# Patient Record
Sex: Female | Born: 1980 | Race: White | Hispanic: No | Marital: Married | Smoking: Never smoker
Health system: Southern US, Community
[De-identification: ages and names within clinical notes are randomized; demographics above are authoritative.]

## PROBLEM LIST (undated history)

## (undated) ENCOUNTER — Inpatient Hospital Stay (HOSPITAL_COMMUNITY): Payer: Self-pay

## (undated) DIAGNOSIS — K219 Gastro-esophageal reflux disease without esophagitis: Secondary | ICD-10-CM

## (undated) HISTORY — DX: Morbid (severe) obesity due to excess calories: E66.01

## (undated) HISTORY — PX: CHOLECYSTECTOMY: SHX55

## (undated) HISTORY — PX: WISDOM TOOTH EXTRACTION: SHX21

---

## 2006-03-22 DIAGNOSIS — K5900 Constipation, unspecified: Secondary | ICD-10-CM | POA: Insufficient documentation

## 2008-01-17 ENCOUNTER — Observation Stay: Payer: Self-pay | Admitting: Obstetrics and Gynecology

## 2008-01-21 ENCOUNTER — Inpatient Hospital Stay: Payer: Self-pay | Admitting: Obstetrics & Gynecology

## 2010-04-08 ENCOUNTER — Ambulatory Visit: Payer: Self-pay | Admitting: Family Medicine

## 2010-04-08 IMAGING — US ABDOMEN ULTRASOUND LIMITED
1 series · 17 of 25 positions shown · non-contrast
Comparison: none

REASON FOR EXAM: abd pain
COMMENTS:

[Series 1: abdomen ultrasound limited · 17 of 74 slices shown]
[im 1/74]
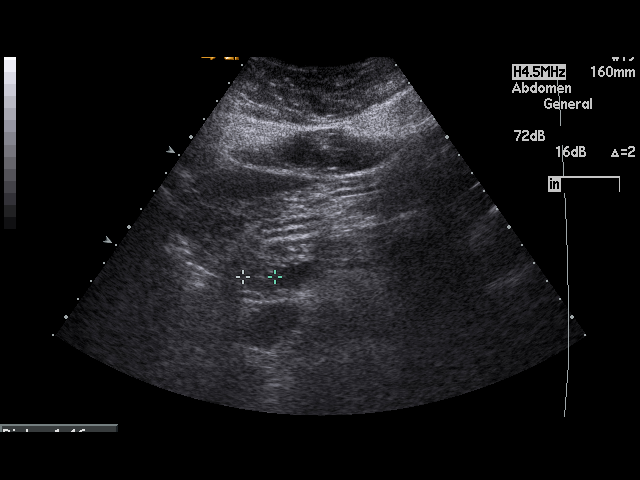
[im 7/74]
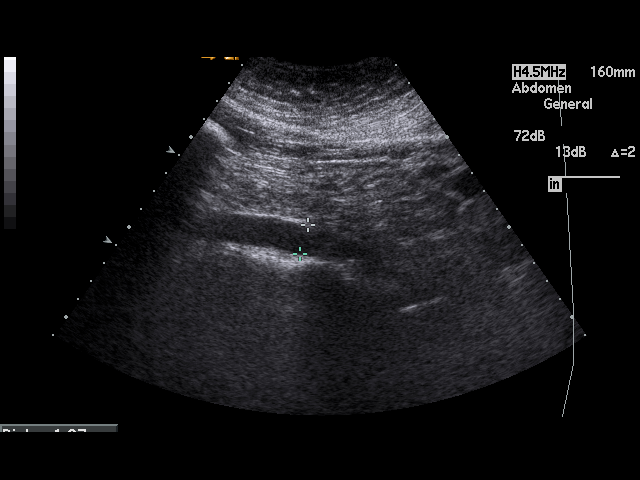
[im 10/74]
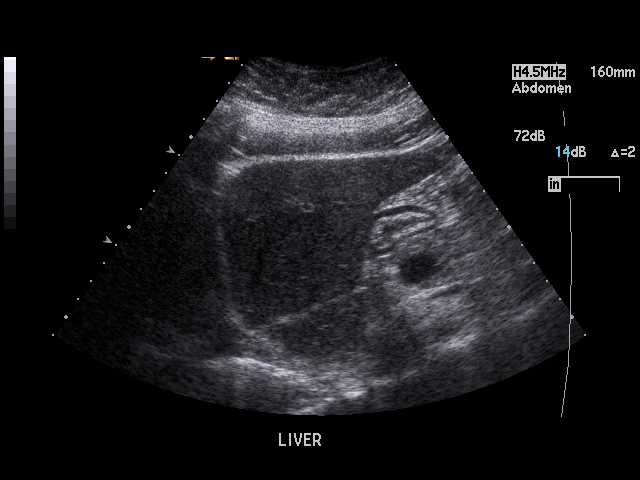
[im 16/74]
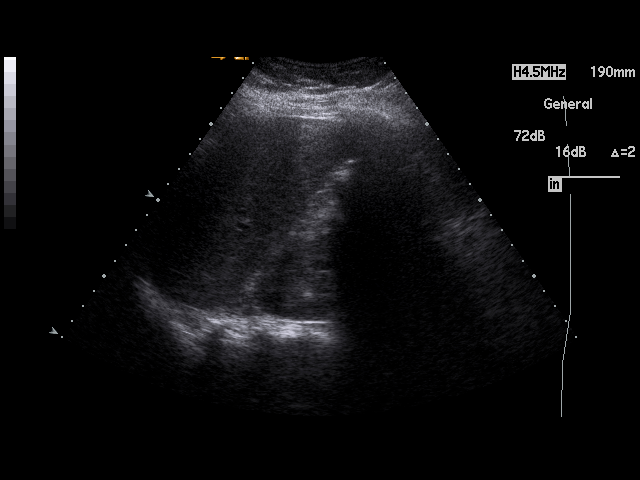
[im 19/74]
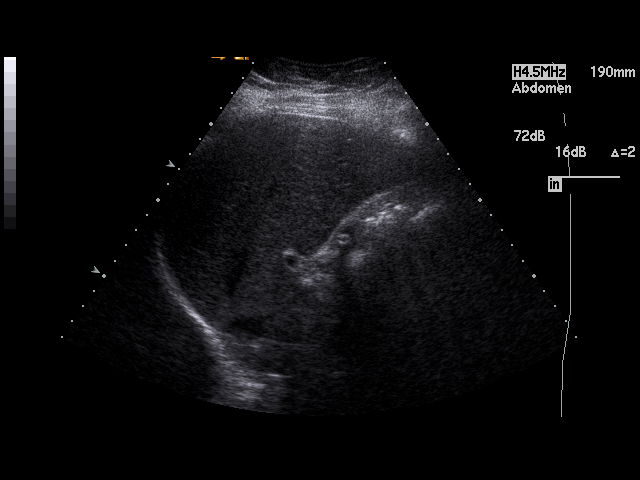
[im 25/74]
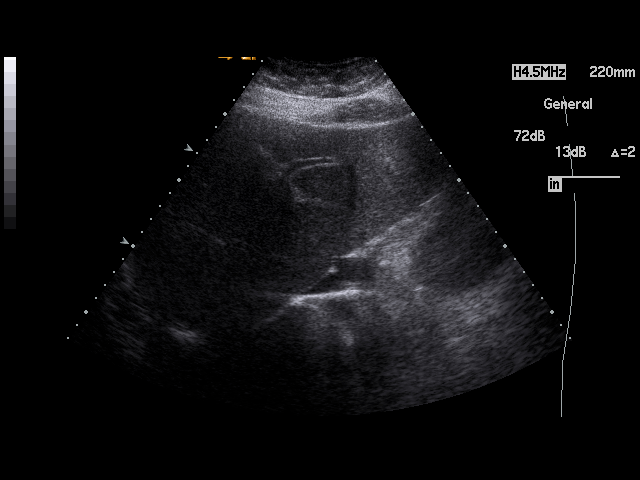
[im 28/74]
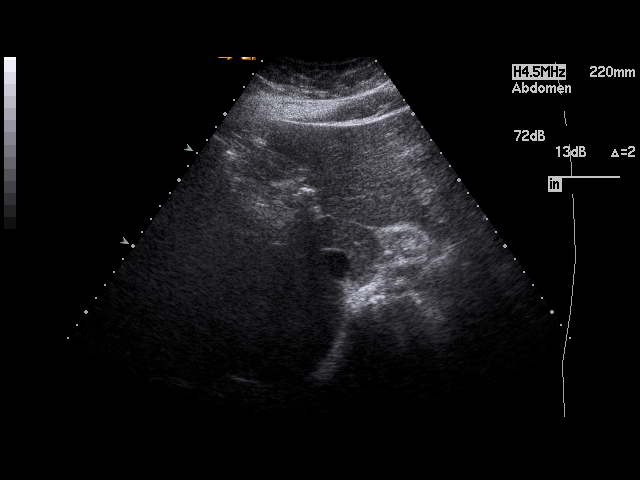
[im 34/74]
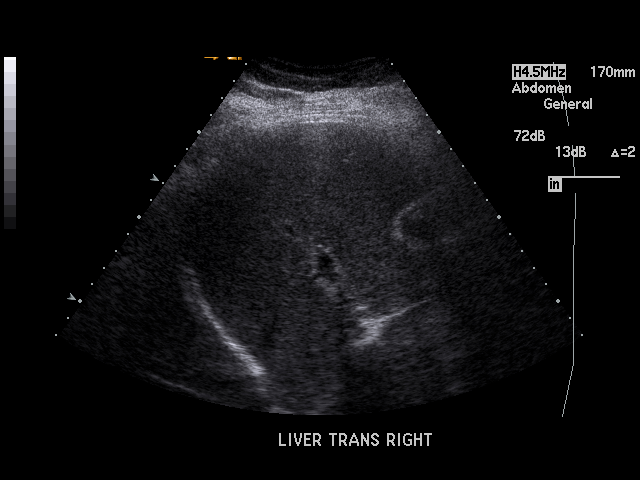
[im 37/74]
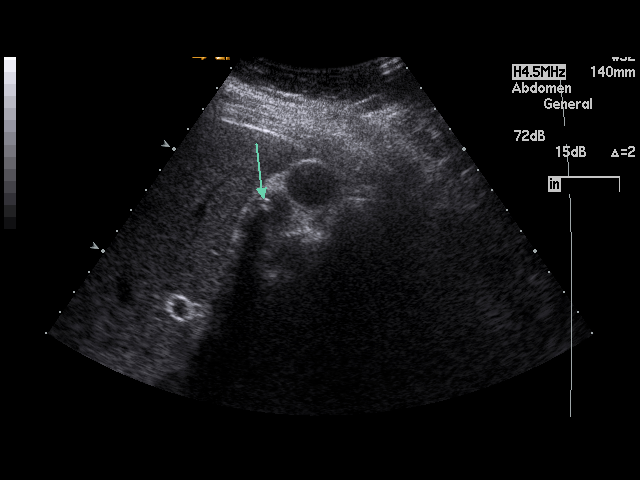
[im 40/74]
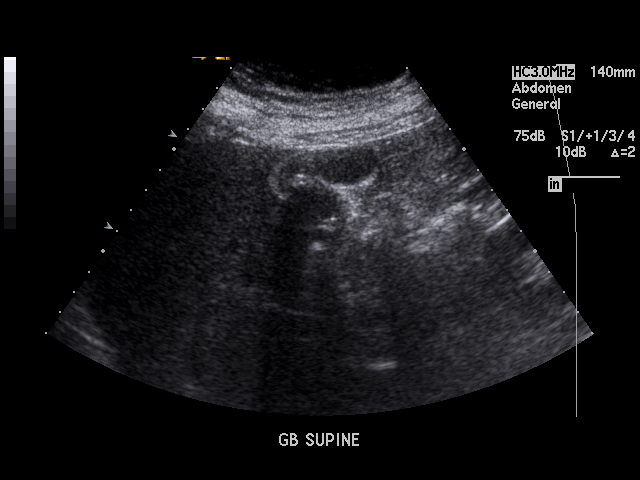
[im 46/74]
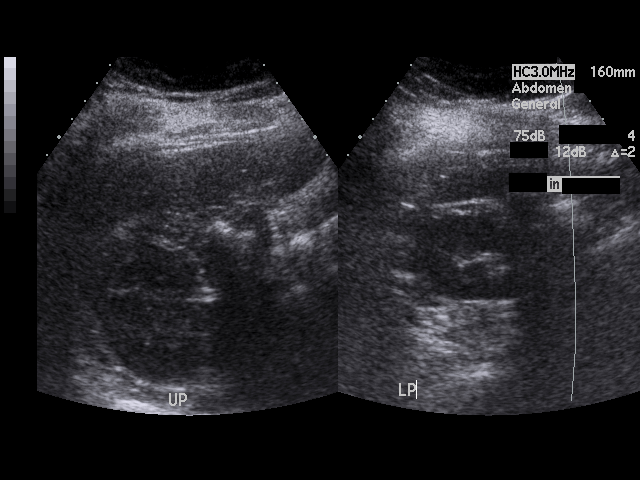
[im 49/74]
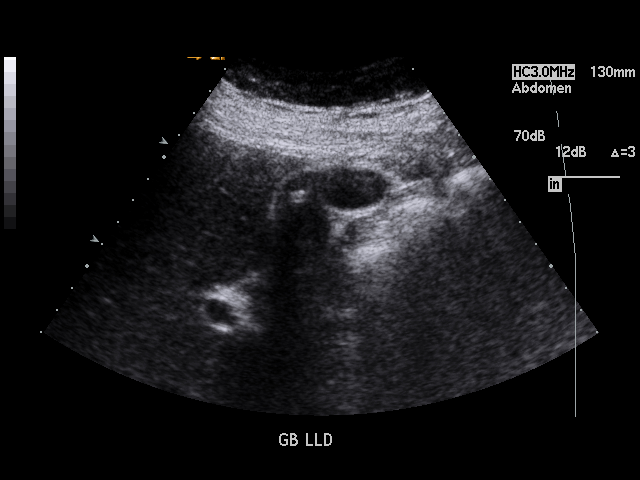
[im 55/74]
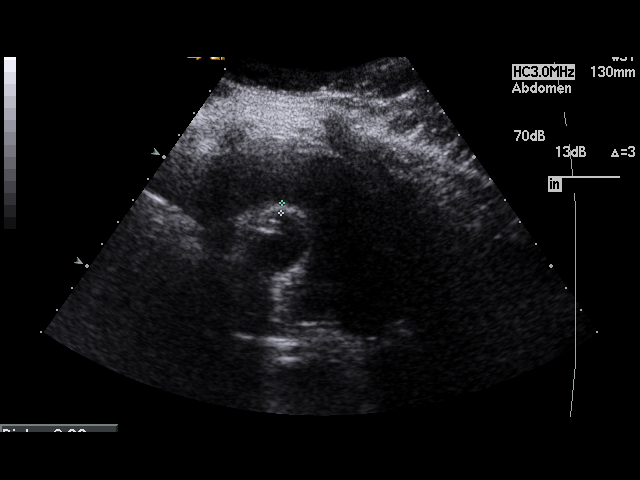
[im 58/74]
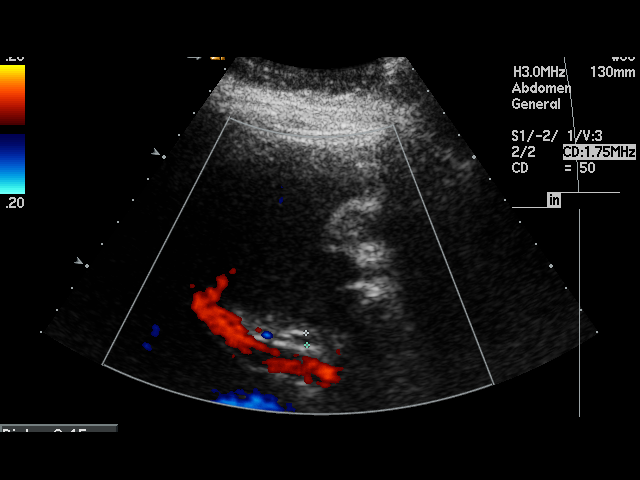
[im 64/74]
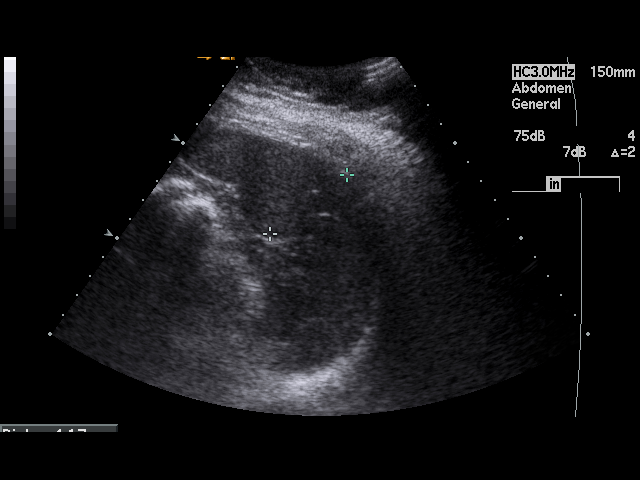
[im 67/74]
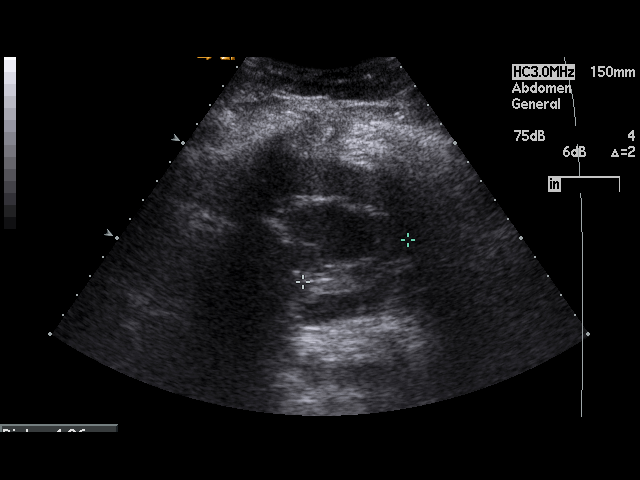
[im 74/74]
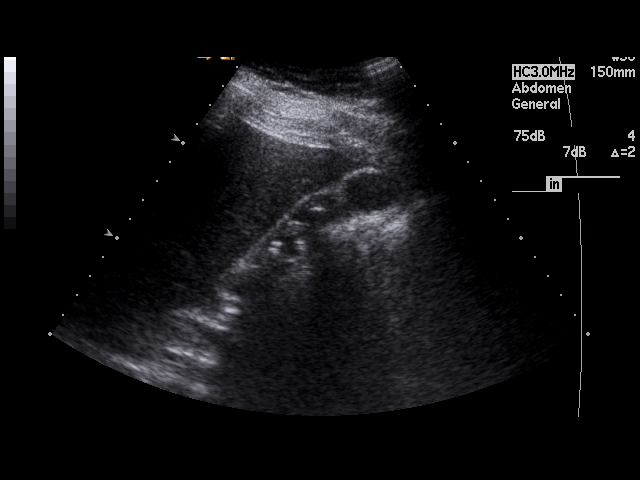

[17 of 25 positions shown; findings below may reference images not displayed]

PROCEDURE:     LONDEN - LONDEN ABDOMEN UPPER GENERAL  - [DATE]  [DATE]

RESULT:     The liver and spleen are normal in appearance. A portion of the
pancreatic tail is obscured by bowel gas but otherwise the pancreas is
normal in appearance. The inferior vena cava and abdominal aorta show no
significant abnormalities. There are multiple shadowing echo densities in
the gallbladder compatible with mobile gallstones. There is thickening of
the gallbladder wall which measures 4.1 mm in thickness. No pericholecystic
fluid is seen. Common bile duct measures 4.5 mm in diameter which is within
normal limits. The kidneys show no hydronephrosis. Sagittally, the right
kidney measures 11.44 cm and the left kidney measures 11.12 cm. No ascites
is seen.
IMPRESSION: 1. Cholelithiasis.
2. There is thickening of the gallbladder wall, suspicious for cholecystitis.

## 2010-05-04 ENCOUNTER — Ambulatory Visit: Payer: Self-pay | Admitting: General Surgery

## 2011-07-18 ENCOUNTER — Encounter: Payer: Self-pay | Admitting: Family Medicine

## 2011-07-18 ENCOUNTER — Ambulatory Visit (INDEPENDENT_AMBULATORY_CARE_PROVIDER_SITE_OTHER): Payer: BC Managed Care – PPO | Admitting: Family Medicine

## 2011-07-18 VITALS — BP 118/76 | HR 70 | Ht 64.0 in | Wt 267.0 lb

## 2011-07-18 DIAGNOSIS — Z3046 Encounter for surveillance of implantable subdermal contraceptive: Secondary | ICD-10-CM

## 2011-07-18 DIAGNOSIS — Z3009 Encounter for other general counseling and advice on contraception: Secondary | ICD-10-CM

## 2011-07-18 DIAGNOSIS — E669 Obesity, unspecified: Secondary | ICD-10-CM | POA: Insufficient documentation

## 2011-07-18 MED ORDER — NORETHIN-ETH ESTRAD BIPHASIC 0.5-35/1-35 MG-MCG PO TABS: 1.0000 | ORAL_TABLET | Freq: Every day | ORAL | Status: DC

## 2011-07-18 NOTE — Patient Instructions (Signed)

## 2011-07-19 NOTE — Progress Notes (Signed)
Subjective:    Patient ID: Patricia Compton, female    DOB: 1981-03-28, 31 y.o.   MRN: [id number]  HPI New pt. Today.  Needs her Implanon removed.  Expired in December of 2012.  Having monthly cycles.  Using condoms.  Desires OC's for contraception.  Needs full physical.  Wants to lose weight. History reviewed. No pertinent past medical history. Past Surgical History  Procedure Date  . Cesarean section   . Wisdom tooth extraction    Family History  Problem Relation Age of Onset  . Hypertension Mother   . Cancer Mother     breast  . COPD Father   . Cancer Maternal Grandmother    History   Social History  . Marital Status: Married    Spouse Name: N/A    Number of Children: N/A  . Years of Education: N/A   Occupational History  . Not on file.   Social History Main Topics  . Smoking status: Never Smoker   . Smokeless tobacco: Not on file  . Alcohol Use: No  . Drug Use: No  . Sexually Active: Yes -- Female partner(s)    Birth Control/ Protection: Implant   Other Topics Concern  . Not on file   Social History Narrative  . No narrative on file   Ms. Kontos does not currently have medications on file. No Known Allergies    Review of Systems  Constitutional: Negative for fever and chills.  HENT: Negative for nosebleeds, congestion and rhinorrhea.   Eyes: Negative for visual disturbance.  Respiratory: Negative for chest tightness and shortness of breath.   Cardiovascular: Negative for chest pain.  Gastrointestinal: Negative for nausea, vomiting, diarrhea, constipation, blood in stool and abdominal distention.       Negative for abdominal pain  Genitourinary: Negative for dysuria and menstrual problem.  Musculoskeletal: Negative for arthralgias.  Neurological: Negative for dizziness and headaches.  Psychiatric/Behavioral: Negative for sleep disturbance and dysphoric mood.  All other systems reviewed and are negative.       Objective:   Physical Exam   Vitals reviewed. Constitutional: She is oriented to person, place, and time. She appears well-developed and well-nourished.  HENT:  Head: Normocephalic and atraumatic.  Eyes: No scleral icterus.  Neck: Neck supple.  Cardiovascular: Normal rate.   Pulmonary/Chest: Effort normal.  Abdominal: Soft.  Neurological: She is alert and oriented to person, place, and time.  Skin: Skin is warm and dry.   Procedure: Patient given informed consent for removal of her Implanon, time out was performed.  Signed copy in the chart.  Appropriate time out taken. Implanon site identified.  Area prepped in usual sterile fashon. One cc of 1% lidocaine was used to anesthetize the area at the distal end of the implant. A small stab incision was made right beside the implant on the distal portion.  The implanon rod was grasped using hemostats and removed without difficulty.  There was less than 3 cc blood loss. There were no complications.  Steri-strips were applied over the small incision.  A pressure bandage was applied to reduce any bruising.  The patient tolerated the procedure well and was given post procedure instructions.         Assessment & Plan:   1. Obesity    Implanon removal  Lengthy discussion was had about weight loss and tactics to help with this, including avoidance of diet sodas, switching to water, increasing exercise, limiting carbs, sweets, fried foods, adding other activities, and food journaling. RTC for  PE

## 2011-08-01 ENCOUNTER — Ambulatory Visit (INDEPENDENT_AMBULATORY_CARE_PROVIDER_SITE_OTHER): Payer: BC Managed Care – PPO | Admitting: Obstetrics & Gynecology

## 2011-08-01 ENCOUNTER — Encounter: Payer: Self-pay | Admitting: Obstetrics & Gynecology

## 2011-08-01 VITALS — BP 120/77 | HR 74 | Ht 64.0 in | Wt 265.0 lb

## 2011-08-01 DIAGNOSIS — Z113 Encounter for screening for infections with a predominantly sexual mode of transmission: Secondary | ICD-10-CM

## 2011-08-01 DIAGNOSIS — Z124 Encounter for screening for malignant neoplasm of cervix: Secondary | ICD-10-CM

## 2011-08-01 DIAGNOSIS — Z01419 Encounter for gynecological examination (general) (routine) without abnormal findings: Secondary | ICD-10-CM

## 2011-08-01 DIAGNOSIS — Z0000 Encounter for general adult medical examination without abnormal findings: Secondary | ICD-10-CM

## 2011-08-01 NOTE — Progress Notes (Signed)
Patient ID: Patricia Compton, female   DOB: Aug 29, 1980, 31 y.o.   MRN: [id number] Subjective:    Patricia Compton is a 31 y.o. female who presents for an annual exam. The patient has no complaints today. The patient is sexually active. GYN screening history: last pap: was normal. The patient wears seatbelts: yes. The patient participates in regular exercise: no. Has the patient ever been transfused or tattooed?: yes.(tattoo) The patient reports that there is not domestic violence in her life.   Menstrual History: OB History    Grav Para Term Preterm Abortions TAB SAB Ect Mult Living   1 1 1       1       Menarche age: 78 Patient's last menstrual period was 08/01/2011.    The following portions of the patient's history were reviewed and updated as appropriate: allergies, current medications, past family history, past medical history, past social history, past surgical history and problem list.  Review of Systems A comprehensive review of systems was negative.    Objective:    BP 120/77  Pulse 74  Ht 5\' 4"  (1.626 m)  Wt 265 lb (120.203 kg)  BMI 45.49 kg/m2  LMP 08/01/2011  General Appearance:    Alert, cooperative, no distress, appears stated age  Head:    Normocephalic, without obvious abnormality, atraumatic  Eyes:    PERRL, conjunctiva/corneas clear, EOM's intact, fundi    benign, both eyes  Ears:    Normal TM's and external ear canals, both ears  Nose:   Nares normal, septum midline, mucosa normal, no drainage    or sinus tenderness  Throat:   Lips, mucosa, and tongue normal; teeth and gums normal  Neck:   Supple, symmetrical, trachea midline, no adenopathy;    thyroid:  no enlargement/tenderness/nodules; no carotid   bruit or JVD  Back:     Symmetric, no curvature, ROM normal, no CVA tenderness  Lungs:     Clear to auscultation bilaterally, respirations unlabored  Chest Wall:    No tenderness or deformity   Heart:    Regular rate and rhythm, S1 and S2 normal, no murmur,  rub   or gallop  Breast Exam:    No tenderness, masses, or nipple abnormality  Abdomen:     Soft, non-tender, bowel sounds active all four quadrants,    no masses, no organomegaly  Genitalia:    Normal female without lesion, discharge or tenderness, NSSA, NT, no adnexal masses     Extremities:   Extremities normal, atraumatic, no cyanosis or edema  Pulses:   2+ and symmetric all extremities  Skin:   Skin color, texture, turgor normal, no rashes or lesions  Lymph nodes:   Cervical, supraclavicular, and axillary nodes normal  Neurologic:   CNII-XII intact, normal strength, sensation and reflexes    throughout  .    Assessment:    Healthy female exam.    Plan:     Pap smear.

## 2011-08-01 NOTE — Progress Notes (Signed)
Here for Yearly Exam.  Doing well.  Is a candidate for BRCA testing mom in her 40's and grandmother.

## 2011-11-15 ENCOUNTER — Encounter: Payer: Self-pay | Admitting: Gynecology

## 2011-11-15 ENCOUNTER — Ambulatory Visit (INDEPENDENT_AMBULATORY_CARE_PROVIDER_SITE_OTHER): Payer: BC Managed Care – PPO | Admitting: Gynecology

## 2011-11-15 VITALS — BP 109/77 | Wt 269.0 lb

## 2011-11-15 DIAGNOSIS — Z3480 Encounter for supervision of other normal pregnancy, unspecified trimester: Secondary | ICD-10-CM

## 2011-11-15 DIAGNOSIS — N912 Amenorrhea, unspecified: Secondary | ICD-10-CM

## 2011-11-16 LAB — OBSTETRIC PANEL
Antibody Screen: NEGATIVE
Basophils Relative: 0 % (ref 0–1)
Eosinophils Absolute: 0.1 10*3/uL (ref 0.0–0.7)
Eosinophils Relative: 1 % (ref 0–5)
HCT: 38.1 % (ref 36.0–46.0)
Hemoglobin: 12.8 g/dL (ref 12.0–15.0)
MCH: 29.7 pg (ref 26.0–34.0)
MCHC: 33.6 g/dL (ref 30.0–36.0)
MCV: 88.4 fL (ref 78.0–100.0)
Monocytes Absolute: 0.5 10*3/uL (ref 0.1–1.0)
Monocytes Relative: 5 % (ref 3–12)
RDW: 13.4 % (ref 11.5–15.5)
Rh Type: NEGATIVE

## 2011-11-16 LAB — HCG, QUANTITATIVE, PREGNANCY: hCG, Beta Chain, Quant, S: 1943.1 m[IU]/mL

## 2011-11-19 LAB — CYSTIC FIBROSIS DIAGNOSTIC STUDY

## 2011-11-20 ENCOUNTER — Ambulatory Visit (INDEPENDENT_AMBULATORY_CARE_PROVIDER_SITE_OTHER): Payer: BC Managed Care – PPO | Admitting: Obstetrics & Gynecology

## 2011-11-20 ENCOUNTER — Other Ambulatory Visit: Payer: Self-pay | Admitting: Obstetrics & Gynecology

## 2011-11-20 ENCOUNTER — Encounter: Payer: Self-pay | Admitting: Obstetrics & Gynecology

## 2011-11-20 VITALS — BP 120/71 | Wt 269.0 lb

## 2011-11-20 DIAGNOSIS — E669 Obesity, unspecified: Secondary | ICD-10-CM

## 2011-11-20 DIAGNOSIS — Z3682 Encounter for antenatal screening for nuchal translucency: Secondary | ICD-10-CM

## 2011-11-20 DIAGNOSIS — O34219 Maternal care for unspecified type scar from previous cesarean delivery: Secondary | ICD-10-CM | POA: Insufficient documentation

## 2011-11-20 DIAGNOSIS — O99210 Obesity complicating pregnancy, unspecified trimester: Secondary | ICD-10-CM

## 2011-11-20 DIAGNOSIS — Z3480 Encounter for supervision of other normal pregnancy, unspecified trimester: Secondary | ICD-10-CM | POA: Insufficient documentation

## 2011-11-20 NOTE — Progress Notes (Signed)
She says her "period" in June was very light and abnormal. On exam today (greatly difficult due to body habitus), I do think she has a 10 week size uterus. She does want the First Screen  Subjective:    Patricia Compton is a G2P1001 [redacted]w[redacted]d being seen today for her first obstetrical visit.  Her obstetrical history is significant for obesity in pregnancy. Patient does intend to breast feed. Pregnancy history fully reviewed.  Patient reports no complaints.  Filed Vitals:   11/20/11 1509  BP: 120/71  Weight: 269 lb (122.018 kg)    HISTORY: OB History    Grav Para Term Preterm Abortions TAB SAB Ect Mult Living   2 1 1       1      # Outc Date GA Lbr Len/2nd Wgt Sex Del Anes PTL Lv   1 TRM 10/09 [redacted]w[redacted]d   M LTCS EPI  Yes   2 CUR              Past Medical History  Diagnosis Date  . Morbid obesity    Past Surgical History  Procedure Date  . Cesarean section   . Wisdom tooth extraction   . Cholecystectomy    Family History  Problem Relation Age of Onset  . Hypertension Mother   . Cancer Mother     breast  . COPD Father   . Cancer Maternal Grandmother      Exam    Uterus:     Pelvic Exam:    Perineum: No Hemorrhoids   Vulva: normal   Vagina:  normal mucosa   pH:    Cervix: anteverted   Adnexa: normal adnexa   Bony Pelvis: android  System: Breast:  normal appearance, no masses or tenderness   Skin: normal coloration and turgor, no rashes    Neurologic: oriented   Extremities: normal strength, tone, and muscle mass   HEENT PERRLA   Mouth/Teeth mucous membranes moist, pharynx normal without lesions   Neck supple   Cardiovascular: regular rate and rhythm   Respiratory:  appears well, vitals normal, no respiratory distress, acyanotic, normal RR, ear and throat exam is normal, neck free of mass or lymphadenopathy, chest clear, no wheezing, crepitations, rhonchi, normal symmetric air entry   Abdomen: soft, non-tender; bowel sounds normal; no masses,  no organomegaly   Urinary: urethral meatus normal      Assessment:    Pregnancy: G2P1001 Patient Active Problem List  Diagnosis  . Obesity        Plan:     Initial labs drawn. Prenatal vitamins. Problem list reviewed and updated. Genetic Screening discussed First Screen and Quad Screen: undecided.  Ultrasound discussed; fetal survey: I have offered her a nutrition consult and have recommended less than 20 pound weight gain in pregnancy due to risks of excessive gain with maternal obesity.   Follow up in 4 weeks.   Kevin Mario C. 11/20/2011

## 2011-11-21 LAB — GC/CHLAMYDIA PROBE AMP, URINE
Chlamydia, Swab/Urine, PCR: NEGATIVE
GC Probe Amp, Urine: NEGATIVE

## 2011-11-22 LAB — URINE CULTURE

## 2011-11-29 ENCOUNTER — Ambulatory Visit (HOSPITAL_COMMUNITY): Admission: RE | Admit: 2011-11-29 | Payer: BC Managed Care – PPO | Source: Ambulatory Visit

## 2011-11-29 ENCOUNTER — Ambulatory Visit (HOSPITAL_COMMUNITY)
Admission: RE | Admit: 2011-11-29 | Discharge: 2011-11-29 | Disposition: A | Discharge status: Home / Self Care | Payer: BC Managed Care – PPO | Source: Ambulatory Visit | Attending: Obstetrics & Gynecology | Admitting: Obstetrics & Gynecology

## 2011-11-29 ENCOUNTER — Other Ambulatory Visit: Payer: Self-pay | Admitting: Obstetrics & Gynecology

## 2011-11-29 ENCOUNTER — Encounter (HOSPITAL_COMMUNITY): Payer: Self-pay

## 2011-11-29 VITALS — BP 123/74 | HR 87 | Wt 268.0 lb

## 2011-11-29 DIAGNOSIS — O34219 Maternal care for unspecified type scar from previous cesarean delivery: Secondary | ICD-10-CM | POA: Insufficient documentation

## 2011-11-29 DIAGNOSIS — Z3689 Encounter for other specified antenatal screening: Secondary | ICD-10-CM | POA: Insufficient documentation

## 2011-11-29 DIAGNOSIS — O99210 Obesity complicating pregnancy, unspecified trimester: Secondary | ICD-10-CM

## 2011-11-29 DIAGNOSIS — Z3682 Encounter for antenatal screening for nuchal translucency: Secondary | ICD-10-CM

## 2011-11-29 IMAGING — US US OB TRANSVAGINAL
1 series · 13 of 28 positions shown · non-contrast
Comparison: none

[Series 1: us ob transvaginal · 0.17mm/px · 13 of 33 slices shown]
[im 2/33]
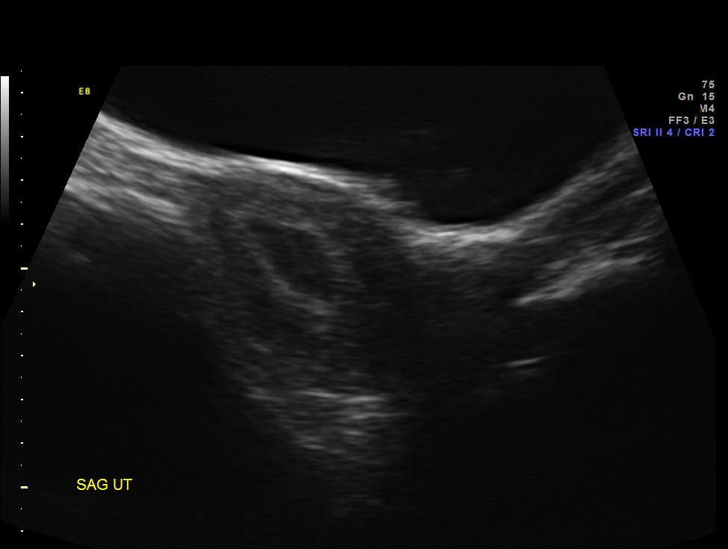
[im 4/33]
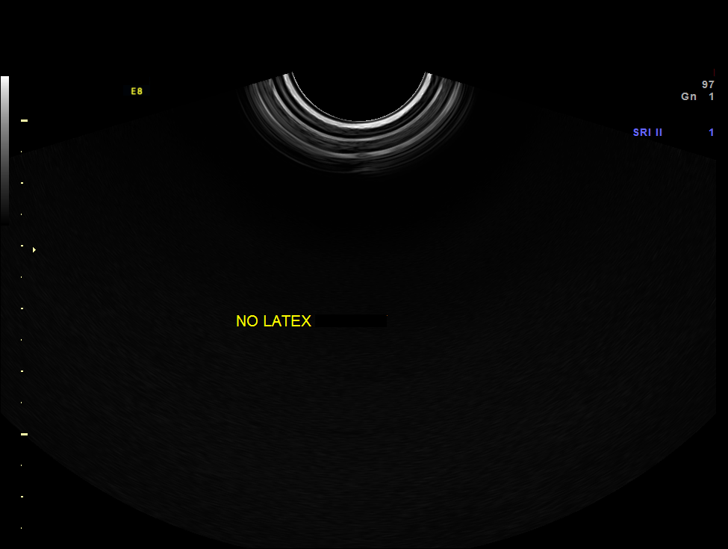
[im 6/33]
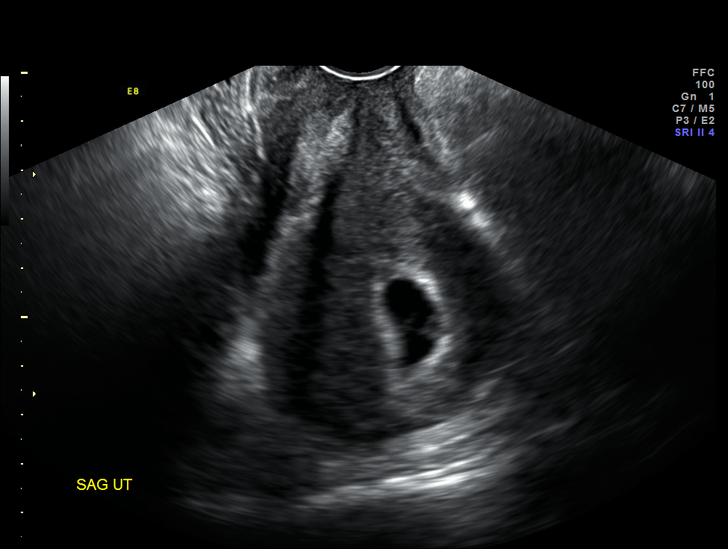
[im 9/33]
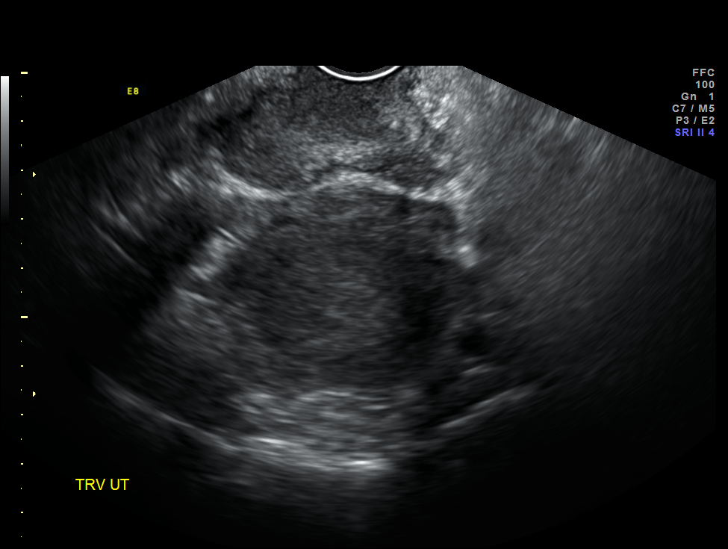
[im 11/33]
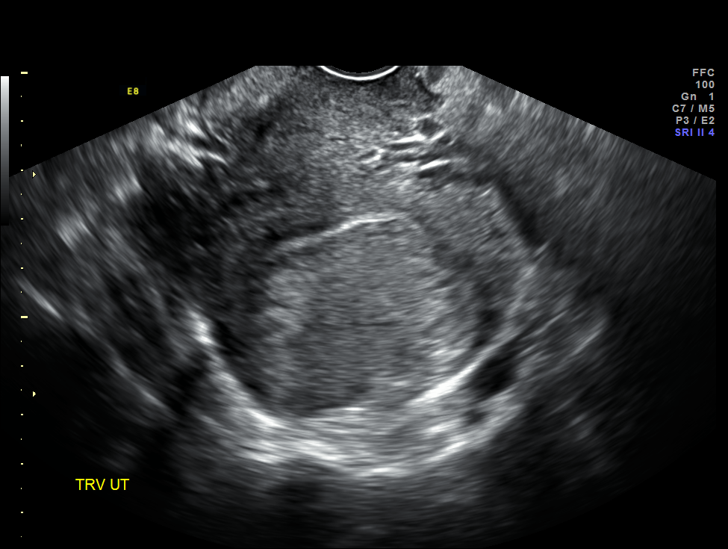
[im 14/33]
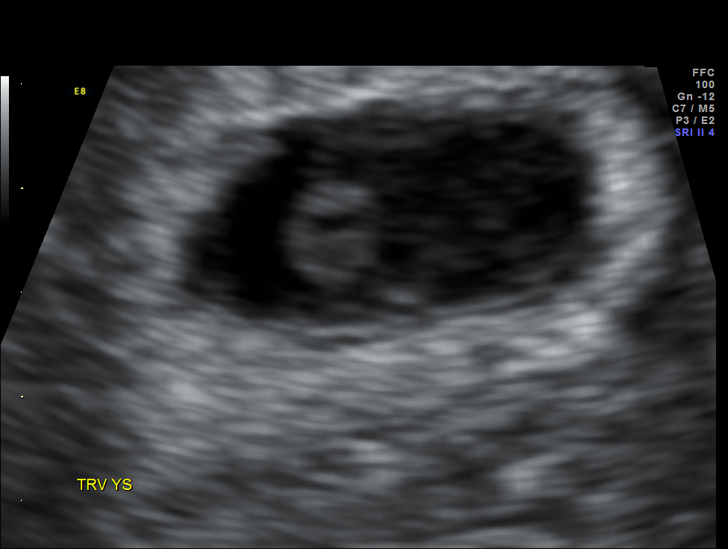
[im 17/33]
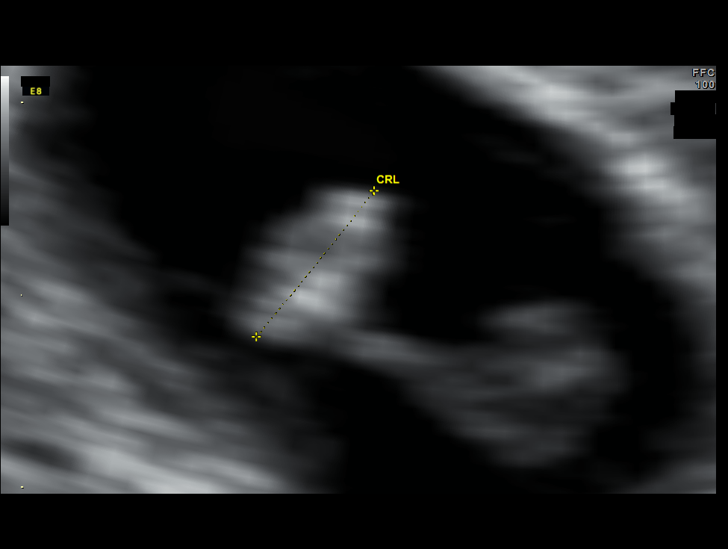
[im 19/33]
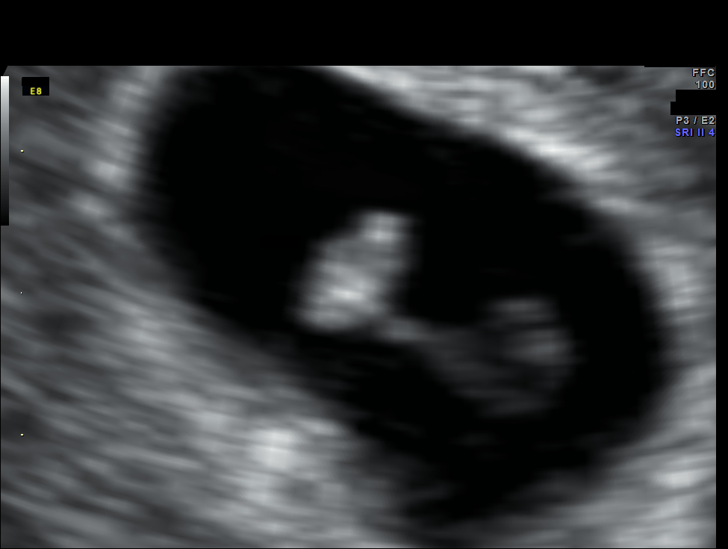
[im 22/33]
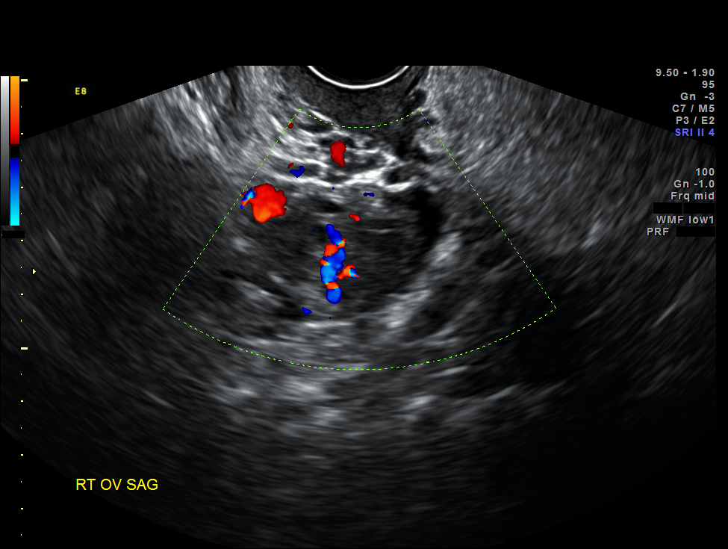
[im 24/33]
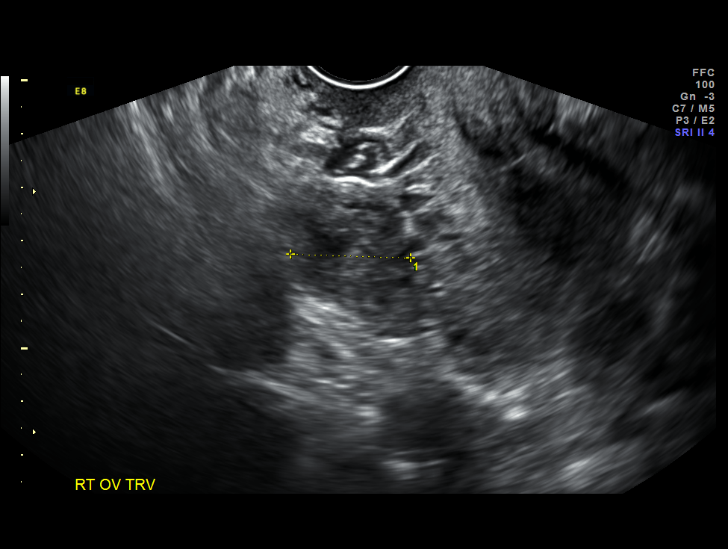
[im 27/33]
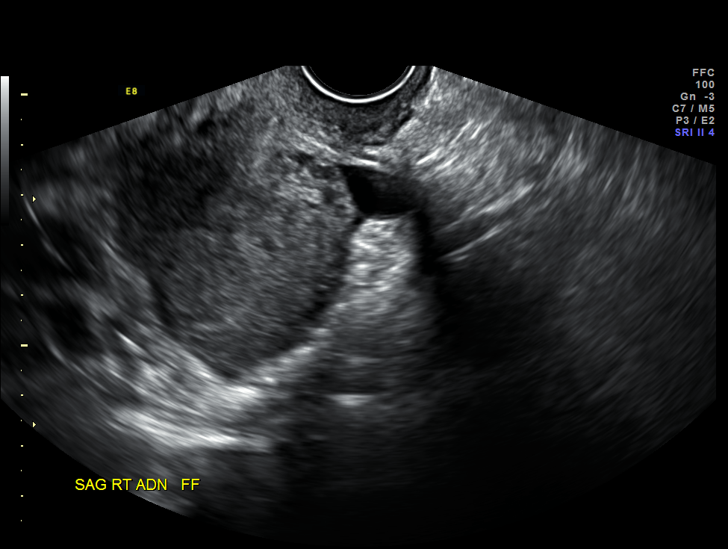
[im 29/33]
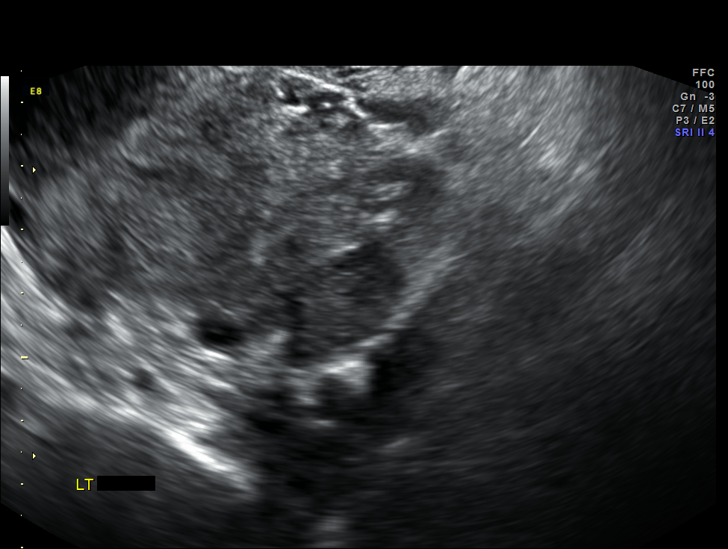
[im 31/33]
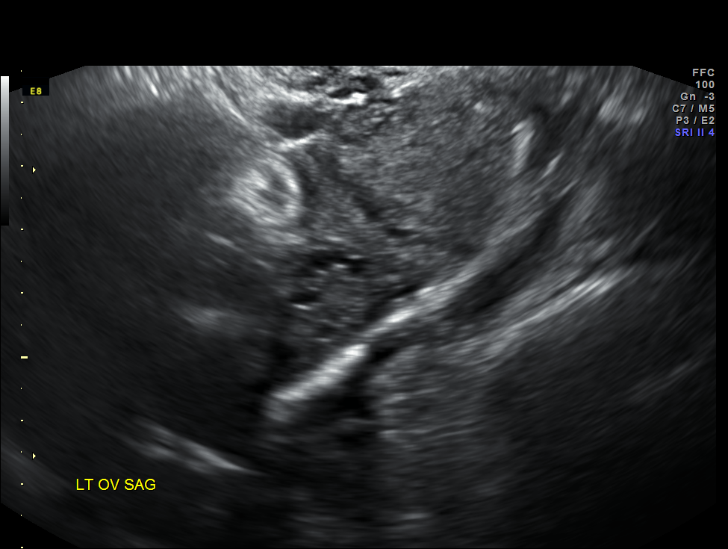

[13 of 28 positions shown; findings below may reference images not displayed]

OBSTETRICS REPORT
                      (Signed Final [DATE] [DATE])

 Order#:         [PHONE_NUMBER]_O,[6L]
                 51_O
Procedures

 US OB TRANSVAGINAL                                    76817.0
 US OB COMP LESS 14 WKS                                76801.0
Indications

 Unsure of LMP;  Establish Gestational [AGE]
 Assess viability
 Previous cesarean section
Fetal Evaluation

 Preg. Location:    Intrauterine
 Gest. Sac:         Intrauterine
 Yolk Sac:          Visualized
 Fetal Pole:        Visualized
 Fetal Heart Rate:  121                          bpm
 Cardiac Activity:  Observed
Biometry

 CRL:      4.8  mm     G. Age:  6w 1d                  EDD:    [DATE]
Gestational Age

 LMP:           7w 6d         Date:  [DATE]                 EDD:   [DATE]
 Best:          6w 1d      Det. By:  U/S C R L ([DATE])     EDD:   [DATE]
Cervix Uterus Adnexa

 Cervix:       Normal appearance by transvaginal scan
 Uterus:       Retroverted.
 Cul De Sac:   Small amount of free fluid seen.
 Left Ovary:    Within normal limits.
 Right Ovary:   Within normal limits. Small corpus luteum noted.

 Adnexa:     No abnormality visualized.
Impression

 Single IUP at 6 [DATE] weeks based on today's study
 New EDC based on today's ultrasound - [DATE]
Recommendations

 Recommend follow up in 6-7 weeks for first trimester screen.

## 2011-11-29 NOTE — Progress Notes (Signed)
Patricia Compton  was seen today for an ultrasound appointment.  See full report in [facility].  Alpha Gula, MD  Single IUP at 6 1/7 weeks based on today's study [facility] based on today's ultrasound - 07/23/2012  Recommend follow up in 6-7 weeks for first trimester screen.

## 2011-12-08 ENCOUNTER — Other Ambulatory Visit (HOSPITAL_COMMUNITY): Payer: BC Managed Care – PPO

## 2011-12-19 ENCOUNTER — Ambulatory Visit (INDEPENDENT_AMBULATORY_CARE_PROVIDER_SITE_OTHER): Payer: BC Managed Care – PPO | Admitting: Obstetrics & Gynecology

## 2011-12-19 VITALS — BP 119/68 | Wt 265.0 lb

## 2011-12-19 DIAGNOSIS — O99210 Obesity complicating pregnancy, unspecified trimester: Secondary | ICD-10-CM

## 2011-12-19 DIAGNOSIS — N390 Urinary tract infection, site not specified: Secondary | ICD-10-CM

## 2011-12-19 DIAGNOSIS — O34219 Maternal care for unspecified type scar from previous cesarean delivery: Secondary | ICD-10-CM

## 2011-12-19 DIAGNOSIS — Z3480 Encounter for supervision of other normal pregnancy, unspecified trimester: Secondary | ICD-10-CM

## 2011-12-19 DIAGNOSIS — E669 Obesity, unspecified: Secondary | ICD-10-CM

## 2011-12-19 LAB — POCT URINALYSIS DIPSTICK
Ketones, UA: NEGATIVE
Spec Grav, UA: <=1.005
pH, UA: 5

## 2011-12-19 NOTE — Progress Notes (Signed)
UA with large blood, trace protein, no LE. Will send for culture and treat accordingly.  Has appointment 01/12/12 for NT.  Recommended Prilosec OTC for heartburn not alleviated by Zantac.  Will monitor symptoms. Obstetric precautions advised.

## 2011-12-19 NOTE — Progress Notes (Signed)
Has lots of indigestion and reflux, unable to drink a lot of liquids because of this.  Stays thirsty.  Burning with urination.

## 2011-12-19 NOTE — Patient Instructions (Addendum)
Pregnancy - First Trimester  During sexual intercourse, millions of sperm go into the vagina. Only 1 sperm will penetrate and fertilize the female egg while it is in the Fallopian tube. One week later, the fertilized egg implants into the wall of the uterus. An embryo begins to develop into a baby. At 6 to 8 weeks, the eyes and face are formed and the heartbeat can be seen on ultrasound. At the end of 12 weeks (first trimester), all the baby's organs are formed. Now that you are pregnant, you will want to do everything you can to have a healthy baby. Two of the most important things are to get good prenatal care and follow your caregiver's instructions. Prenatal care is all the medical care you receive before the baby's birth. It is given to prevent, find, and treat problems during the pregnancy and childbirth.  PRENATAL EXAMS   During prenatal visits, your weight, blood pressure and urine are checked. This is done to make sure you are healthy and progressing normally during the pregnancy.   A pregnant woman should gain 25 to 35 pounds during the pregnancy. However, if you are over weight or underweight, your caregiver will advise you regarding your weight.   Your caregiver will ask and answer questions for you.   Blood work, cervical cultures, other necessary tests and a Pap test are done during your prenatal exams. These tests are done to check on your health and the probable health of your baby. Tests are strongly recommended and done for HIV with your permission. This is the virus that causes AIDS. These tests are done because medications can be given to help prevent your baby from being born with this infection should you have been infected without knowing it. Blood work is also used to find out your blood type, previous infections and follow your blood levels (hemoglobin).   Low hemoglobin (anemia) is common during pregnancy. Iron and vitamins are given to help prevent this. Later in the pregnancy, blood  tests for diabetes will be done along with any other tests if any problems develop. You may need tests to make sure you and the baby are doing well.   You may need other tests to make sure you and the baby are doing well.  CHANGES DURING THE FIRST TRIMESTER (THE FIRST 3 MONTHS OF PREGNANCY)  Your body goes through many changes during pregnancy. They vary from person to person. Talk to your caregiver about changes you notice and are concerned about. Changes can include:   Your menstrual period stops.   The egg and sperm carry the genes that determine what you look like. Genes from you and your partner are forming a baby. The female genes determine whether the baby is a boy or a girl.   Your body increases in girth and you may feel bloated.   Feeling sick to your stomach (nauseous) and throwing up (vomiting). If the vomiting is uncontrollable, call your caregiver.   Your breasts will begin to enlarge and become tender.   Your nipples may stick out more and become darker.   The need to urinate more. Painful urination may mean you have a bladder infection.   Tiring easily.   Loss of appetite.   Cravings for certain kinds of food.   At first, you may gain or lose a couple of pounds.   You may have changes in your emotions from day to day (excited to be pregnant or concerned something may go wrong with   the pregnancy and baby).   You may have more vivid and strange dreams.  HOME CARE INSTRUCTIONS    It is very important to avoid all smoking, alcohol and un-prescribed drugs during your pregnancy. These affect the formation and growth of the baby. Avoid chemicals while pregnant to ensure the delivery of a healthy infant.   Start your prenatal visits by the 12th week of pregnancy. They are usually scheduled monthly at first, then more often in the last 2 months before delivery. Keep your caregiver's appointments. Follow your caregiver's instructions regarding medication use, blood and lab tests, exercise, and  diet.   During pregnancy, you are providing food for you and your baby. Eat regular, well-balanced meals. Choose foods such as meat, fish, milk and other low fat dairy products, vegetables, fruits, and whole-grain breads and cereals. Your caregiver will tell you of the ideal weight gain.   You can help morning sickness by keeping soda crackers at the bedside. Eat a couple before arising in the morning. You may want to use the crackers without salt on them.   Eating 4 to 5 small meals rather than 3 large meals a day also may help the nausea and vomiting.   Drinking liquids between meals instead of during meals also seems to help nausea and vomiting.   A physical sexual relationship may be continued throughout pregnancy if there are no other problems. Problems may be early (premature) leaking of amniotic fluid from the membranes, vaginal bleeding, or belly (abdominal) pain.   Exercise regularly if there are no restrictions. Check with your caregiver or physical therapist if you are unsure of the safety of some of your exercises. Greater weight gain will occur in the last 2 trimesters of pregnancy. Exercising will help:   Control your weight.   Keep you in shape.   Prepare you for labor and delivery.   Help you lose your pregnancy weight after you deliver your baby.   Wear a good support or jogging bra for breast tenderness during pregnancy. This may help if worn during sleep too.   Ask when prenatal classes are available. Begin classes when they are offered.   Do not use hot tubs, steam rooms or saunas.   Wear your seat belt when driving. This protects you and your baby if you are in an accident.   Avoid raw meat, uncooked cheese, cat litter boxes and soil used by cats throughout the pregnancy. These carry germs that can cause birth defects in the baby.   The first trimester is a good time to visit your dentist for your dental health. Getting your teeth cleaned is OK. Use a softer toothbrush and brush  gently during pregnancy.   Ask for help if you have financial, counseling or nutritional needs during pregnancy. Your caregiver will be able to offer counseling for these needs as well as refer you for other special needs.   Do not take any medications or herbs unless told by your caregiver.   Inform your caregiver if there is any mental or physical domestic violence.   Make a list of emergency phone numbers of family, friends, hospital, and police and fire departments.   Write down your questions. Take them to your prenatal visit.   Do not douche.   Do not cross your legs.   If you have to stand for long periods of time, rotate you feet or take small steps in a circle.   You may have more vaginal secretions that may   require a sanitary pad. Do not use tampons or scented sanitary pads.  MEDICATIONS AND DRUG USE IN PREGNANCY   Take prenatal vitamins as directed. The vitamin should contain 1 milligram of folic acid. Keep all vitamins out of reach of children. Only a couple vitamins or tablets containing iron may be fatal to a baby or young child when ingested.   Avoid use of all medications, including herbs, over-the-counter medications, not prescribed or suggested by your caregiver. Only take over-the-counter or prescription medicines for pain, discomfort, or fever as directed by your caregiver. Do not use aspirin, ibuprofen, or naproxen unless directed by your caregiver.   Let your caregiver also know about herbs you may be using.   Alcohol is related to a number of birth defects. This includes fetal alcohol syndrome. All alcohol, in any form, should be avoided completely. Smoking will cause low birth rate and premature babies.   Street or illegal drugs are very harmful to the baby. They are absolutely forbidden. A baby born to an addicted mother will be addicted at birth. The baby will go through the same withdrawal an adult does.   Let your caregiver know about any medications that you have to take  and for what reason you take them.  MISCARRIAGE IS COMMON DURING PREGNANCY  A miscarriage does not mean you did something wrong. It is not a reason to worry about getting pregnant again. Your caregiver will help you with questions you may have. If you have a miscarriage, you may need minor surgery.  SEEK MEDICAL CARE IF:   You have any concerns or worries during your pregnancy. It is better to call with your questions if you feel they cannot wait, rather than worry about them.  SEEK IMMEDIATE MEDICAL CARE IF:    An unexplained oral temperature above 102 F (38.9 C) develops, or as your caregiver suggests.   You have leaking of fluid from the vagina (birth canal). If leaking membranes are suspected, take your temperature and inform your caregiver of this when you call.   There is vaginal spotting or bleeding. Notify your caregiver of the amount and how many pads are used.   You develop a bad smelling vaginal discharge with a change in the color.   You continue to feel sick to your stomach (nauseated) and have no relief from remedies suggested. You vomit blood or coffee ground-like materials.   You lose more than 2 pounds of weight in 1 week.   You gain more than 2 pounds of weight in 1 week and you notice swelling of your face, hands, feet, or legs.   You gain 5 pounds or more in 1 week (even if you do not have swelling of your hands, face, legs, or feet).   You get exposed to German measles and have never had them.   You are exposed to fifth disease or chickenpox.   You develop belly (abdominal) pain. Round ligament discomfort is a common non-cancerous (benign) cause of abdominal pain in pregnancy. Your caregiver still must evaluate this.   You develop headache, fever, diarrhea, pain with urination, or shortness of breath.   You fall or are in a car accident or have any kind of trauma.   There is mental or physical violence in your home.  Document Released: 03/21/2001 Document Revised: 03/16/2011  Document Reviewed: 09/22/2008  [employer] Patient Information 2012 [employer]

## 2011-12-21 LAB — CULTURE, OB URINE

## 2012-01-12 ENCOUNTER — Ambulatory Visit (HOSPITAL_COMMUNITY)
Admission: RE | Admit: 2012-01-12 | Discharge: 2012-01-12 | Disposition: A | Discharge status: Home / Self Care | Payer: BC Managed Care – PPO | Source: Ambulatory Visit | Attending: Family Medicine | Admitting: Family Medicine

## 2012-01-12 ENCOUNTER — Other Ambulatory Visit (HOSPITAL_COMMUNITY): Payer: Self-pay | Admitting: Maternal and Fetal Medicine

## 2012-01-12 ENCOUNTER — Ambulatory Visit (HOSPITAL_COMMUNITY): Admission: RE | Admit: 2012-01-12 | Payer: BC Managed Care – PPO | Source: Ambulatory Visit

## 2012-01-12 DIAGNOSIS — Z3682 Encounter for antenatal screening for nuchal translucency: Secondary | ICD-10-CM

## 2012-01-12 DIAGNOSIS — O99210 Obesity complicating pregnancy, unspecified trimester: Secondary | ICD-10-CM

## 2012-01-12 DIAGNOSIS — O34219 Maternal care for unspecified type scar from previous cesarean delivery: Secondary | ICD-10-CM | POA: Insufficient documentation

## 2012-01-12 IMAGING — US US OB COMP LESS 14 WK
1 series · 14 of 20 positions shown · non-contrast
Comparison: none

[Series 1: us ob comp less 14 wk · 0.18mm/px · 14 of 20 slices shown]
[im 1/20]
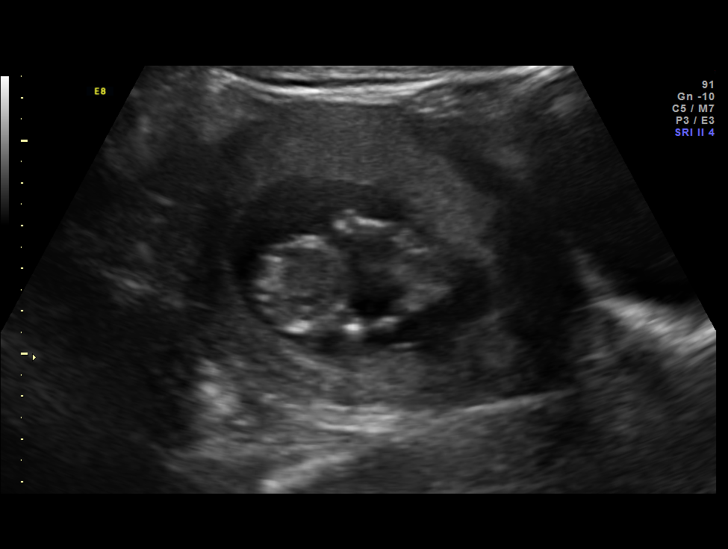
[im 3/20]
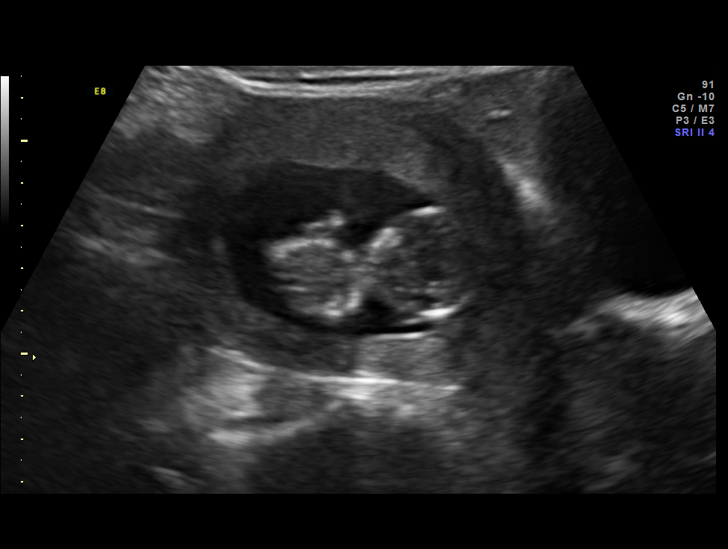
[im 4/20]
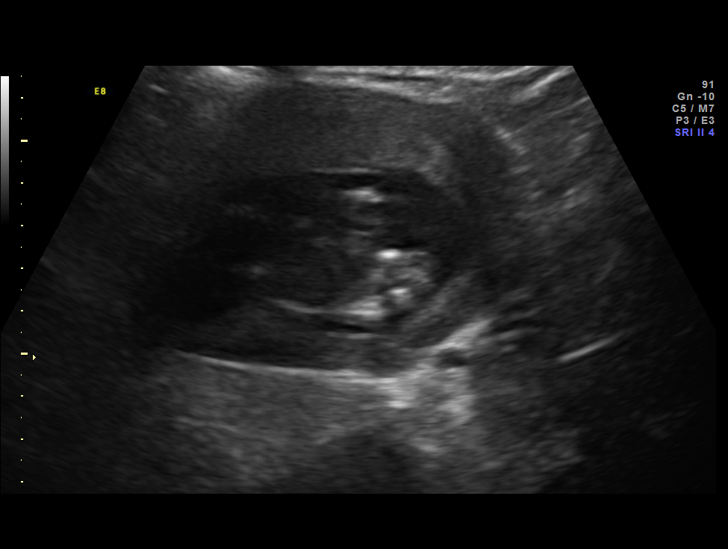
[im 6/20]
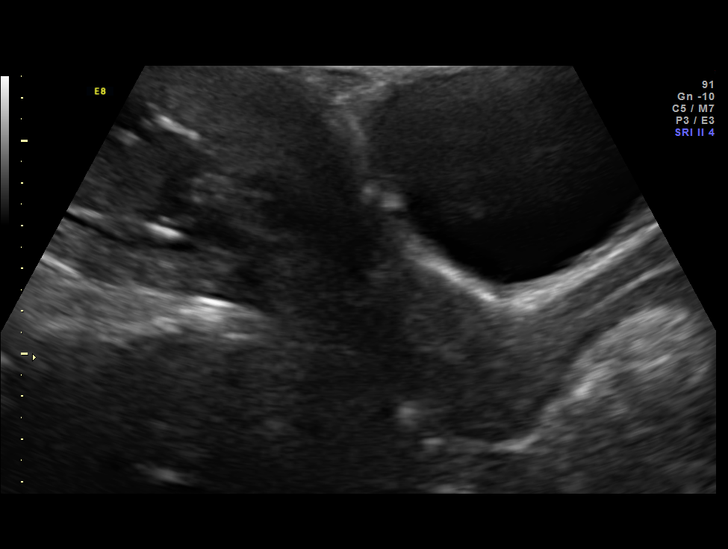
[im 7/20]
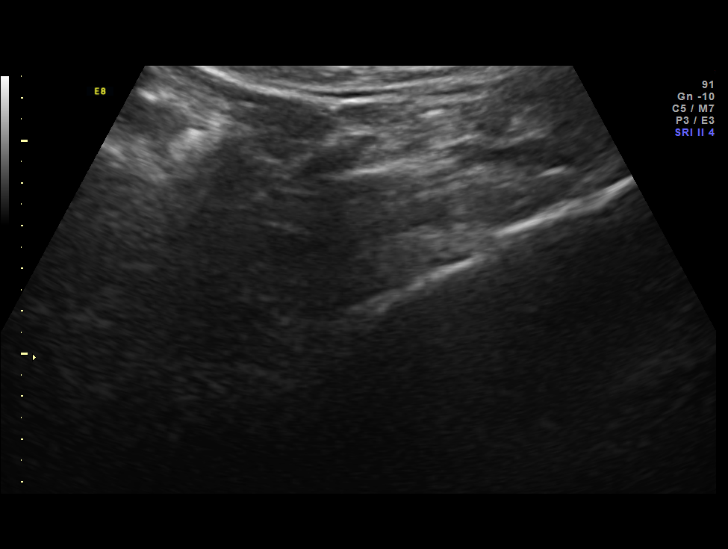
[im 8/20]
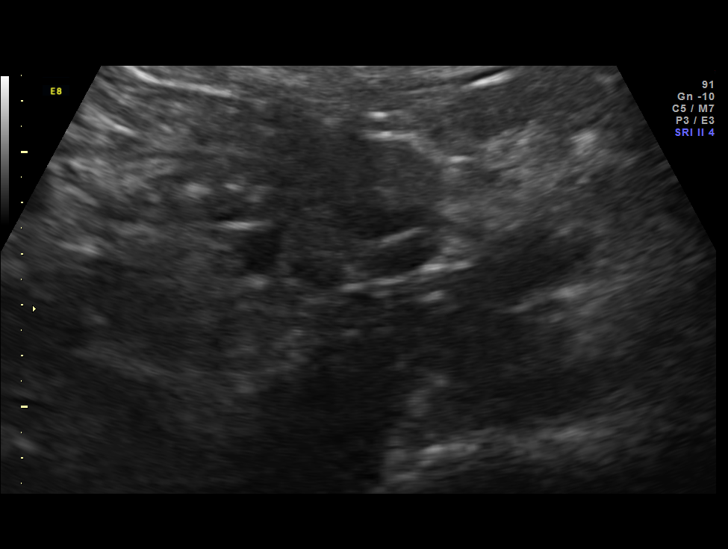
[im 10/20]
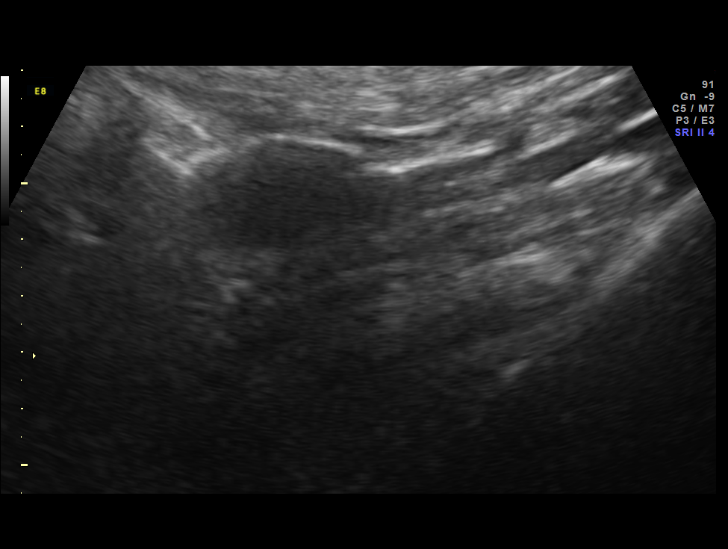
[im 11/20]
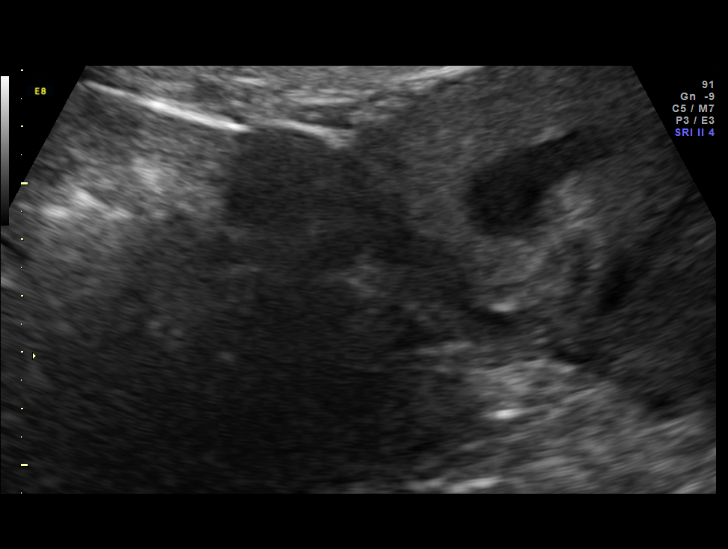
[im 13/20]
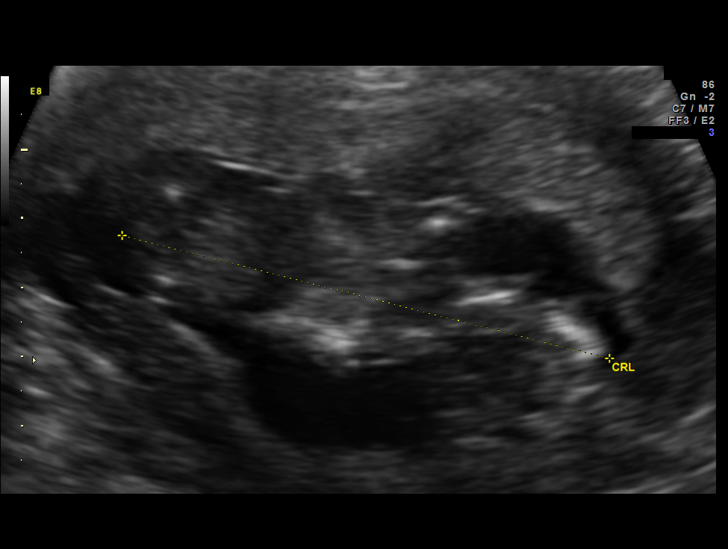
[im 14/20]
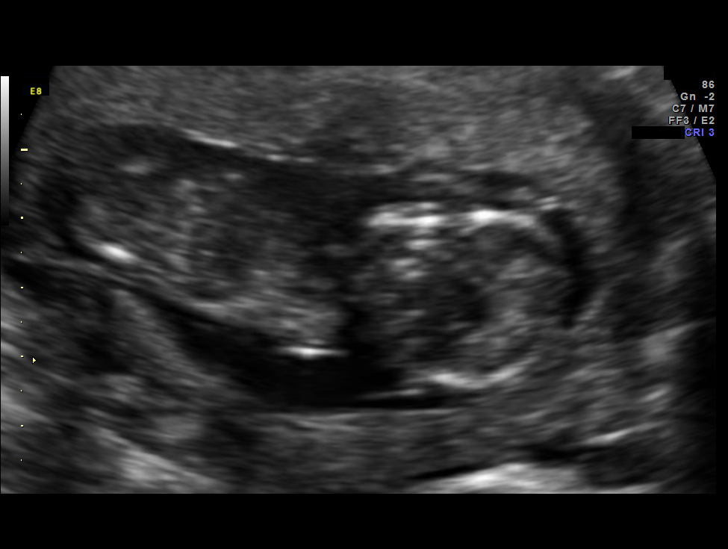
[im 16/20]
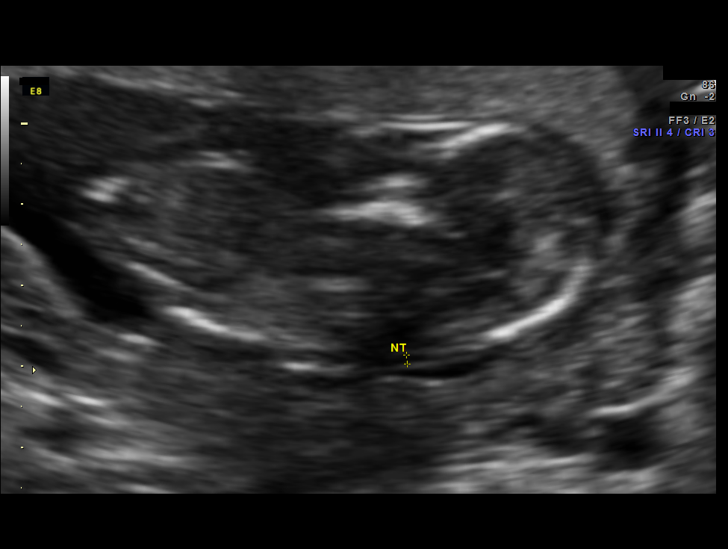
[im 17/20]
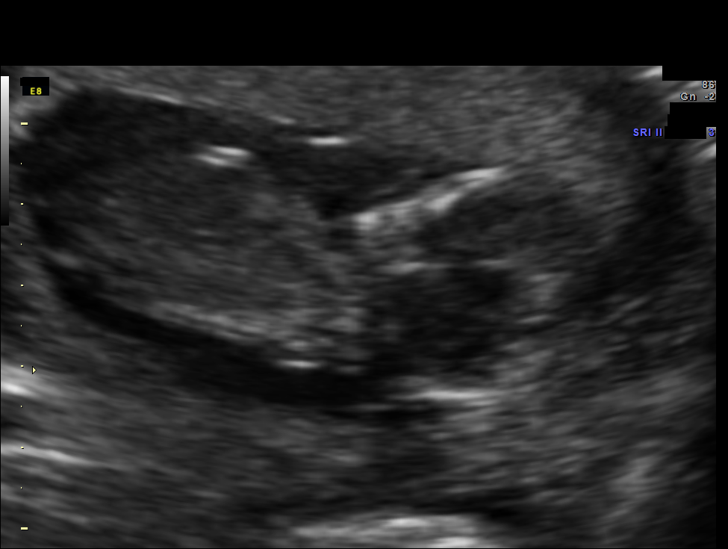
[im 18/20]
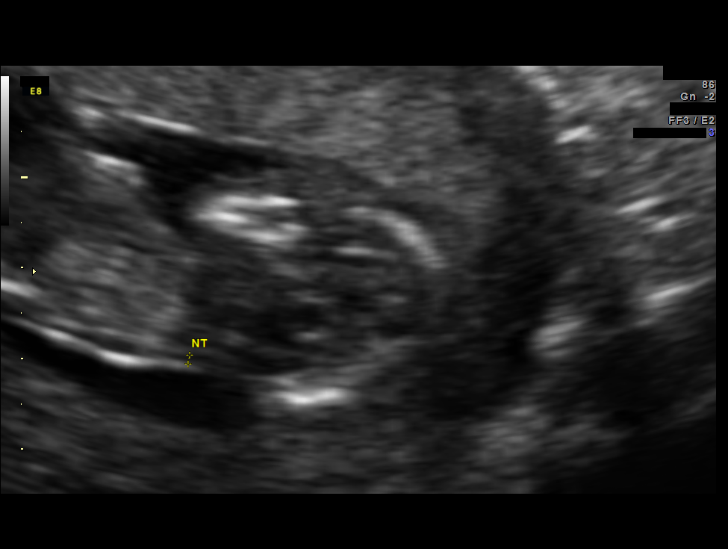
[im 20/20]
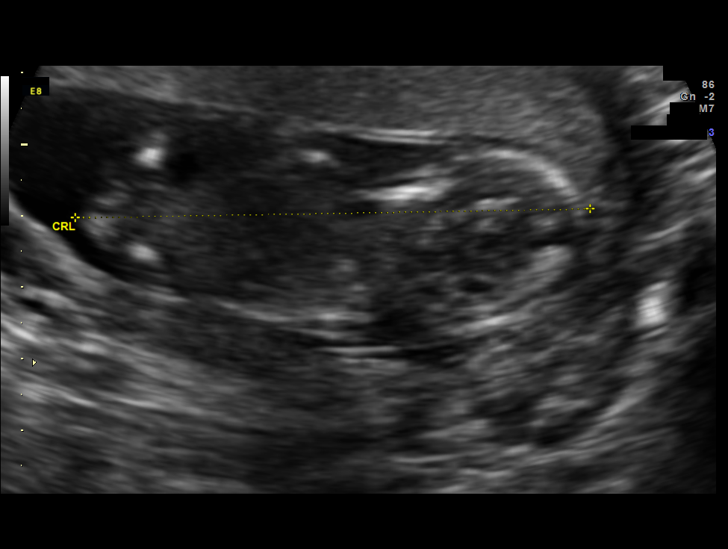

[14 of 20 positions shown; findings below may reference images not displayed]

OBSTETRICS REPORT
                      (Signed Final [DATE] [DATE])

Service(s) Provided

 US OB COMP LESS 14 WKS                                76801.0
Indications

 Previous cesarean section
Fetal Evaluation

 Num Of Fetuses:    1
 Fetal Heart Rate:  148                          bpm
 Cardiac Activity:  Observed

 Amniotic Fluid
 AFI FV:      Subjectively within normal limits
Biometry

 CRL:     72.1  mm     G. Age:  13w 1d                 EDD:    [DATE]
 NT:       1.1  mm
Gestational Age

 LMP:           14w 1d        Date:  [DATE]                 EDD:   [DATE]
 Best:          12w 3d     Det. By:  U/S C R L ([DATE])     EDD:   [DATE]
Cervix Uterus Adnexa

 Cervix:       Normal appearance by transabdominal scan.

 Left Ovary:    Within normal limits.
 Right Ovary:   Within normal limits.
Impression

 Single IUP at 12 [DATE] weeks
 Unable to adequately measure NT due to fetal position and
 maternal body habitus.
Recommendations

 Recommend follow up next week for reevaluation.
 If unable to adequaately measure NT at that time, would offer
 cell free fetal DNA vs. serum screen

## 2012-01-12 NOTE — Progress Notes (Signed)
Patricia Compton  was seen today for an ultrasound appointment.  See full report in [facility].  Alpha Gula, MD  Single IUP at 12 3/7 weeks Unable to adequately measure NT due to fetal position and maternal body habitus.  Recommend follow up next week for reevaluation.   If unable to adequaately measure NT at that time, would offer cell free fetal DNA vs. serum screen

## 2012-01-16 ENCOUNTER — Other Ambulatory Visit: Payer: Self-pay | Admitting: Family Medicine

## 2012-01-16 ENCOUNTER — Ambulatory Visit (INDEPENDENT_AMBULATORY_CARE_PROVIDER_SITE_OTHER): Payer: BC Managed Care – PPO | Admitting: Family Medicine

## 2012-01-16 VITALS — BP 109/68 | Wt 270.0 lb

## 2012-01-16 DIAGNOSIS — Z3480 Encounter for supervision of other normal pregnancy, unspecified trimester: Secondary | ICD-10-CM

## 2012-01-16 DIAGNOSIS — Z3682 Encounter for antenatal screening for nuchal translucency: Secondary | ICD-10-CM

## 2012-01-16 NOTE — Patient Instructions (Signed)
Pregnancy - Second Trimester The second trimester of pregnancy (3 to 6 months) is a period of rapid growth for you and your baby. At the end of the sixth month, your baby is about 9 inches long and weighs 1 1/2 pounds. You will begin to feel the baby move between 18 and 20 weeks of the pregnancy. This is called quickening. Weight gain is faster. A clear fluid (colostrum) may leak out of your breasts. You may feel small contractions of the womb (uterus). This is known as false labor or Braxton-Hicks contractions. This is like a practice for labor when the baby is ready to be born. Usually, the problems with morning sickness have usually passed by the end of your first trimester. Some women develop small dark blotches (called cholasma, mask of pregnancy) on their face that usually goes away after the baby is born. Exposure to the sun makes the blotches worse. Acne may also develop in some pregnant women and pregnant women who have acne, may find that it goes away. PRENATAL EXAMS  Blood work may continue to be done during prenatal exams. These tests are done to check on your health and the probable health of your baby. Blood work is used to follow your blood levels (hemoglobin). Anemia (low hemoglobin) is common during pregnancy. Iron and vitamins are given to help prevent this. You will also be checked for diabetes between 24 and 28 weeks of the pregnancy. Some of the previous blood tests may be repeated.  The size of the uterus is measured during each visit. This is to make sure that the baby is continuing to grow properly according to the dates of the pregnancy.  Your blood pressure is checked every prenatal visit. This is to make sure you are not getting toxemia.  Your urine is checked to make sure you do not have an infection, diabetes or protein in the urine.  Your weight is checked often to make sure gains are happening at the suggested rate. This is to ensure that both you and your baby are  growing normally.  Sometimes, an ultrasound is performed to confirm the proper growth and development of the baby. This is a test which bounces harmless sound waves off the baby so your caregiver can more accurately determine due dates. Sometimes, a specialized test is done on the amniotic fluid surrounding the baby. This test is called an amniocentesis. The amniotic fluid is obtained by sticking a needle into the belly (abdomen). This is done to check the chromosomes in instances where there is a concern about possible genetic problems with the baby. It is also sometimes done near the end of pregnancy if an early delivery is required. In this case, it is done to help make sure the baby's lungs are mature enough for the baby to live outside of the womb. CHANGES OCCURING IN THE SECOND TRIMESTER OF PREGNANCY Your body goes through many changes during pregnancy. They vary from person to person. Talk to your caregiver about changes you notice that you are concerned about.  During the second trimester, you will likely have an increase in your appetite. It is normal to have cravings for certain foods. This varies from person to person and pregnancy to pregnancy.  Your lower abdomen will begin to bulge.  You may have to urinate more often because the uterus and baby are pressing on your bladder. It is also common to get more bladder infections during pregnancy (pain with urination). You can help this by   drinking lots of fluids and emptying your bladder before and after intercourse.  You may begin to get stretch marks on your hips, abdomen, and breasts. These are normal changes in the body during pregnancy. There are no exercises or medications to take that prevent this change.  You may begin to develop swollen and bulging veins (varicose veins) in your legs. Wearing support hose, elevating your feet for 15 minutes, 3 to 4 times a day and limiting salt in your diet helps lessen the problem.  Heartburn may  develop as the uterus grows and pushes up against the stomach. Antacids recommended by your caregiver helps with this problem. Also, eating smaller meals 4 to 5 times a day helps.  Constipation can be treated with a stool softener or adding bulk to your diet. Drinking lots of fluids, vegetables, fruits, and whole grains are helpful.  Exercising is also helpful. If you have been very active up until your pregnancy, most of these activities can be continued during your pregnancy. If you have been less active, it is helpful to start an exercise program such as walking.  Hemorrhoids (varicose veins in the rectum) may develop at the end of the second trimester. Warm sitz baths and hemorrhoid cream recommended by your caregiver helps hemorrhoid problems.  Backaches may develop during this time of your pregnancy. Avoid heavy lifting, wear low heal shoes and practice good posture to help with backache problems.  Some pregnant women develop tingling and numbness of their hand and fingers because of swelling and tightening of ligaments in the wrist (carpel tunnel syndrome). This goes away after the baby is born.  As your breasts enlarge, you may have to get a bigger bra. Get a comfortable, cotton, support bra. Do not get a nursing bra until the last month of the pregnancy if you will be nursing the baby.  You may get a dark line from your belly button to the pubic area called the linea nigra.  You may develop rosy cheeks because of increase blood flow to the face.  You may develop spider looking lines of the face, neck, arms and chest. These go away after the baby is born. HOME CARE INSTRUCTIONS   It is extremely important to avoid all smoking, herbs, alcohol, and unprescribed drugs during your pregnancy. These chemicals affect the formation and growth of the baby. Avoid these chemicals throughout the pregnancy to ensure the delivery of a healthy infant.  Most of your home care instructions are the same  as suggested for the first trimester of your pregnancy. Keep your caregiver's appointments. Follow your caregiver's instructions regarding medication use, exercise and diet.  During pregnancy, you are providing food for you and your baby. Continue to eat regular, well-balanced meals. Choose foods such as meat, fish, milk and other low fat dairy products, vegetables, fruits, and whole-grain breads and cereals. Your caregiver will tell you of the ideal weight gain.  A physical sexual relationship may be continued up until near the end of pregnancy if there are no other problems. Problems could include early (premature) leaking of amniotic fluid from the membranes, vaginal bleeding, abdominal pain, or other medical or pregnancy problems.  Exercise regularly if there are no restrictions. Check with your caregiver if you are unsure of the safety of some of your exercises. The greatest weight gain will occur in the last 2 trimesters of pregnancy. Exercise will help you:  Control your weight.  Get you in shape for labor and delivery.  Lose weight   after you have the baby.  Wear a good support or jogging bra for breast tenderness during pregnancy. This may help if worn during sleep. Pads or tissues may be used in the bra if you are leaking colostrum.  Do not use hot tubs, steam rooms or saunas throughout the pregnancy.  Wear your seat belt at all times when driving. This protects you and your baby if you are in an accident.  Avoid raw meat, uncooked cheese, cat litter boxes and soil used by cats. These carry germs that can cause birth defects in the baby.  The second trimester is also a good time to visit your dentist for your dental health if this has not been done yet. Getting your teeth cleaned is OK. Use a soft toothbrush. Brush gently during pregnancy.  It is easier to loose urine during pregnancy. Tightening up and strengthening the pelvic muscles will help with this problem. Practice stopping  your urination while you are going to the bathroom. These are the same muscles you need to strengthen. It is also the muscles you would use as if you were trying to stop from passing gas. You can practice tightening these muscles up 10 times a set and repeating this about 3 times per day. Once you know what muscles to tighten up, do not perform these exercises during urination. It is more likely to contribute to an infection by backing up the urine.  Ask for help if you have financial, counseling or nutritional needs during pregnancy. Your caregiver will be able to offer counseling for these needs as well as refer you for other special needs.  Your skin may become oily. If so, wash your face with mild soap, use non-greasy moisturizer and oil or cream based makeup. MEDICATIONS AND DRUG USE IN PREGNANCY  Take prenatal vitamins as directed. The vitamin should contain 1 milligram of folic acid. Keep all vitamins out of reach of children. Only a couple vitamins or tablets containing iron may be fatal to a baby or young child when ingested.  Avoid use of all medications, including herbs, over-the-counter medications, not prescribed or suggested by your caregiver. Only take over-the-counter or prescription medicines for pain, discomfort, or fever as directed by your caregiver. Do not use aspirin.  Let your caregiver also know about herbs you may be using.  Alcohol is related to a number of birth defects. This includes fetal alcohol syndrome. All alcohol, in any form, should be avoided completely. Smoking will cause low birth rate and premature babies.  Street or illegal drugs are very harmful to the baby. They are absolutely forbidden. A baby born to an addicted mother will be addicted at birth. The baby will go through the same withdrawal an adult does. SEEK MEDICAL CARE IF:  You have any concerns or worries during your pregnancy. It is better to call with your questions if you feel they cannot wait,  rather than worry about them. SEEK IMMEDIATE MEDICAL CARE IF:   An unexplained oral temperature above 102 F (38.9 C) develops, or as your caregiver suggests.  You have leaking of fluid from the vagina (birth canal). If leaking membranes are suspected, take your temperature and tell your caregiver of this when you call.  There is vaginal spotting, bleeding, or passing clots. Tell your caregiver of the amount and how many pads are used. Light spotting in pregnancy is common, especially following intercourse.  You develop a bad smelling vaginal discharge with a change in the color from clear   to white.  You continue to feel sick to your stomach (nauseated) and have no relief from remedies suggested. You vomit blood or coffee ground-like materials.  You lose more than 2 pounds of weight or gain more than 2 pounds of weight over 1 week, or as suggested by your caregiver.  You notice swelling of your face, hands, feet, or legs.  You get exposed to German measles and have never had them.  You are exposed to fifth disease or chickenpox.  You develop belly (abdominal) pain. Round ligament discomfort is a common non-cancerous (benign) cause of abdominal pain in pregnancy. Your caregiver still must evaluate you.  You develop a bad headache that does not go away.  You develop fever, diarrhea, pain with urination, or shortness of breath.  You develop visual problems, blurry, or double vision.  You fall or are in a car accident or any kind of trauma.  There is mental or physical violence at home. Document Released: 03/21/2001 Document Revised: 06/19/2011 Document Reviewed: 09/23/2008 [employer] Patient Information 2013 [employer].  Breastfeeding Deciding to breastfeed is one of the best choices you can make for you and your baby. The information that follows gives a brief overview of the benefits of breastfeeding as well as common topics surrounding breastfeeding. BENEFITS OF  BREASTFEEDING For the baby  The first milk (colostrum) helps the baby's digestive system function better.   There are antibodies in the mother's milk that help the baby fight off infections.   The baby has a lower incidence of asthma, allergies, and sudden infant death syndrome (SIDS).   The nutrients in breast milk are better for the baby than infant formulas, and breast milk helps the baby's brain grow better.   Babies who breastfeed have less gas, colic, and constipation.  For the mother  Breastfeeding helps develop a very special bond between the mother and her baby.   Breastfeeding is convenient, always available at the correct temperature, and costs nothing.   Breastfeeding burns calories in the mother and helps her lose weight that was gained during pregnancy.   Breastfeeding makes the uterus contract back down to normal size faster and slows bleeding following delivery.   Breastfeeding mothers have a lower risk of developing breast cancer.  BREASTFEEDING FREQUENCY  A healthy, full-term baby may breastfeed as often as every hour or space his or her feedings to every 3 hours.   Watch your baby for signs of hunger. Nurse your baby if he or she shows signs of hunger. How often you nurse will vary from baby to baby.   Nurse as often as the baby requests, or when you feel the need to reduce the fullness of your breasts.   Awaken the baby if it has been 3 4 hours since the last feeding.   Frequent feeding will help the mother make more milk and will help prevent problems, such as sore nipples and engorgement of the breasts.  BABY'S POSITION AT THE BREAST  Whether lying down or sitting, be sure that the baby's tummy is facing your tummy.   Support the breast with 4 fingers underneath the breast and the thumb above. Make sure your fingers are well away from the nipple and baby's mouth.   Stroke the baby's lips gently with your finger or nipple.   When the  baby's mouth is open wide enough, place all of your nipple and as much of the areola as possible into your baby's mouth.   Pull the baby in   close so the tip of the nose and the baby's cheeks touch the breast during the feeding.  FEEDINGS AND SUCTION  The length of each feeding varies from baby to baby and from feeding to feeding.   The baby must suck about 2 3 minutes for your milk to get to him or her. This is called a "let down." For this reason, allow the baby to feed on each breast as long as he or she wants. Your baby will end the feeding when he or she has received the right balance of nutrients.   To break the suction, put your finger into the corner of the baby's mouth and slide it between his or her gums before removing your breast from his or her mouth. This will help prevent sore nipples.  HOW TO TELL WHETHER YOUR BABY IS GETTING ENOUGH BREAST MILK. Wondering whether or not your baby is getting enough milk is a common concern among mothers. You can be assured that your baby is getting enough milk if:   Your baby is actively sucking and you hear swallowing.   Your baby seems relaxed and satisfied after a feeding.   Your baby nurses at least 8 12 times in a 24 hour time period. Nurse your baby until he or she unlatches or falls asleep at the first breast (at least 10 20 minutes), then offer the second side.   Your baby is wetting 5 6 disposable diapers (6 8 cloth diapers) in a 24 hour period by 5 6 days of age.   Your baby is having at least 3 4 stools every 24 hours for the first 6 weeks. The stool should be soft and yellow.   Your baby should gain 4 7 ounces per week after he or she is 4 days old.   Your breasts feel softer after nursing.  REDUCING BREAST ENGORGEMENT  In the first week after your baby is born, you may experience signs of breast engorgement. When breasts are engorged, they feel heavy, warm, full, and may be tender to the touch. You can reduce  engorgement if you:   Nurse frequently, every 2 3 hours. Mothers who breastfeed early and often have fewer problems with engorgement.   Place light ice packs on your breasts for 10 20 minutes between feedings. This reduces swelling. Wrap the ice packs in a lightweight towel to protect your skin. Bags of frozen vegetables work well for this purpose.   Take a warm shower or apply warm, moist heat to your breast for 5 10 minutes just before each feeding. This increases circulation and helps the milk flow.   Gently massage your breast before and during the feeding. Using your finger tips, massage from the chest wall towards your nipple in a circular motion.   Make sure that the baby empties at least one breast at every feeding before switching sides.   Use a breast pump to empty the breasts if your baby is sleepy or not nursing well. You may also want to pump if you are returning to work oryou feel you are getting engorged.   Avoid bottle feeds, pacifiers, or supplemental feedings of water or juice in place of breastfeeding. Breast milk is all the food your baby needs. It is not necessary for your baby to have water or formula. In fact, to help your breasts make more milk, it is best not to give your baby supplemental feedings during the early weeks.   Be sure the baby is latched   on and positioned properly while breastfeeding.   Wear a supportive bra, avoiding underwire styles.   Eat a balanced diet with enough fluids.   Rest often, relax, and take your prenatal vitamins to prevent fatigue, stress, and anemia.  If you follow these suggestions, your engorgement should improve in 24 48 hours. If you are still experiencing difficulty, call your lactation consultant or caregiver.  CARING FOR YOURSELF Take care of your breasts  Bathe or shower daily.   Avoid using soap on your nipples.   Start feedings on your left breast at one feeding and on your right breast at the next  feeding.   You will notice an increase in your milk supply 2 5 days after delivery. You may feel some discomfort from engorgement, which makes your breasts very firm and often tender. Engorgement "peaks" out within 24 48 hours. In the meantime, apply warm moist towels to your breasts for 5 10 minutes before feeding. Gentle massage and expression of some milk before feeding will soften your breasts, making it easier for your baby to latch on.   Wear a well-fitting nursing bra, and air dry your nipples for a 3 4minutes after each feeding.   Only use cotton bra pads.   Only use pure lanolin on your nipples after nursing. You do not need to wash it off before feeding the baby again. Another option is to express a few drops of breast milk and gently massage it into your nipples.  Take care of yourself  Eat well-balanced meals and nutritious snacks.   Drinking milk, fruit juice, and water to satisfy your thirst (about 8 glasses a day).   Get plenty of rest.  Avoid foods that you notice affect the baby in a bad way.  SEEK MEDICAL CARE IF:   You have difficulty with breastfeeding and need help.   You have a hard, red, sore area on your breast that is accompanied by a fever.   Your baby is too sleepy to eat well or is having trouble sleeping.   Your baby is wetting less than 6 diapers a day, by 5 days of age.   Your baby's skin or white part of his or her eyes is more yellow than it was in the hospital.   You feel depressed.  Document Released: 03/27/2005 Document Revised: 09/26/2011 Document Reviewed: 06/25/2011 [employer] Patient Information 2013 [employer].  

## 2012-01-16 NOTE — Progress Notes (Signed)
Doing well. She had an attempt at nuchal translucency but was unsuccessful.  Unsure about returning secondary to cost.  Could offer Quad if not done.  Early one hour today

## 2012-01-17 ENCOUNTER — Ambulatory Visit (HOSPITAL_COMMUNITY): Payer: BC Managed Care – PPO | Attending: Obstetrics & Gynecology

## 2012-01-17 ENCOUNTER — Ambulatory Visit (HOSPITAL_COMMUNITY): Payer: BC Managed Care – PPO

## 2012-01-19 LAB — GLUCOSE TOLERANCE, 1 HOUR (50G) W/O FASTING: Glucose, 1 Hour GTT: 119 mg/dL (ref 70–140)

## 2012-01-31 ENCOUNTER — Encounter: Payer: Self-pay | Admitting: Obstetrics and Gynecology

## 2012-01-31 ENCOUNTER — Ambulatory Visit (INDEPENDENT_AMBULATORY_CARE_PROVIDER_SITE_OTHER): Payer: BC Managed Care – PPO | Admitting: Obstetrics and Gynecology

## 2012-01-31 VITALS — BP 117/69 | Wt 269.0 lb

## 2012-01-31 DIAGNOSIS — E669 Obesity, unspecified: Secondary | ICD-10-CM

## 2012-01-31 DIAGNOSIS — O34219 Maternal care for unspecified type scar from previous cesarean delivery: Secondary | ICD-10-CM

## 2012-01-31 DIAGNOSIS — O99210 Obesity complicating pregnancy, unspecified trimester: Secondary | ICD-10-CM

## 2012-01-31 DIAGNOSIS — Z3480 Encounter for supervision of other normal pregnancy, unspecified trimester: Secondary | ICD-10-CM

## 2012-01-31 NOTE — Progress Notes (Signed)
Patient doing well, complaining of persistent reflux and emesis. Patient not able to eat much due to emesis. Advised little sips of water and small meals. If not able to tolerate food, patient should take ensure shakes. Patient also advised to take zofran which was previously prescribed but she was not taking it. Patient desires Quad screen- to be done at next visit. Anatomy u/s ordered for the week of nov 18.

## 2012-02-13 ENCOUNTER — Encounter: Payer: BC Managed Care – PPO | Admitting: Obstetrics and Gynecology

## 2012-02-21 ENCOUNTER — Ambulatory Visit (HOSPITAL_COMMUNITY)
Admission: RE | Admit: 2012-02-21 | Discharge: 2012-02-21 | Disposition: A | Discharge status: Home / Self Care | Payer: BC Managed Care – PPO | Source: Ambulatory Visit | Attending: Obstetrics and Gynecology | Admitting: Obstetrics and Gynecology

## 2012-02-21 DIAGNOSIS — Z1389 Encounter for screening for other disorder: Secondary | ICD-10-CM | POA: Insufficient documentation

## 2012-02-21 DIAGNOSIS — Z363 Encounter for antenatal screening for malformations: Secondary | ICD-10-CM | POA: Insufficient documentation

## 2012-02-21 DIAGNOSIS — Z3480 Encounter for supervision of other normal pregnancy, unspecified trimester: Secondary | ICD-10-CM

## 2012-02-21 DIAGNOSIS — O358XX0 Maternal care for other (suspected) fetal abnormality and damage, not applicable or unspecified: Secondary | ICD-10-CM | POA: Insufficient documentation

## 2012-02-21 IMAGING — US US OB DETAIL+14 WK
1 of 2 series · 12 of 28 positions shown · non-contrast
Comparison: none

[Series 1: us ob detail +14 wk · 12 of 73 slices shown]
[im 1/73]
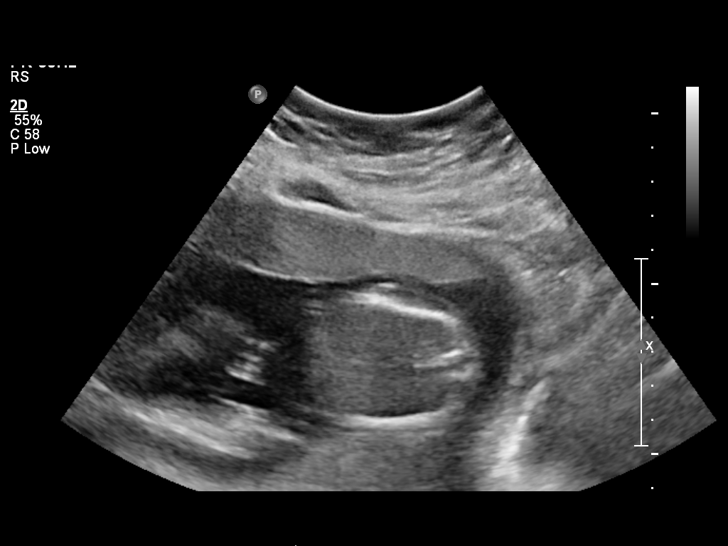
[im 6/73]
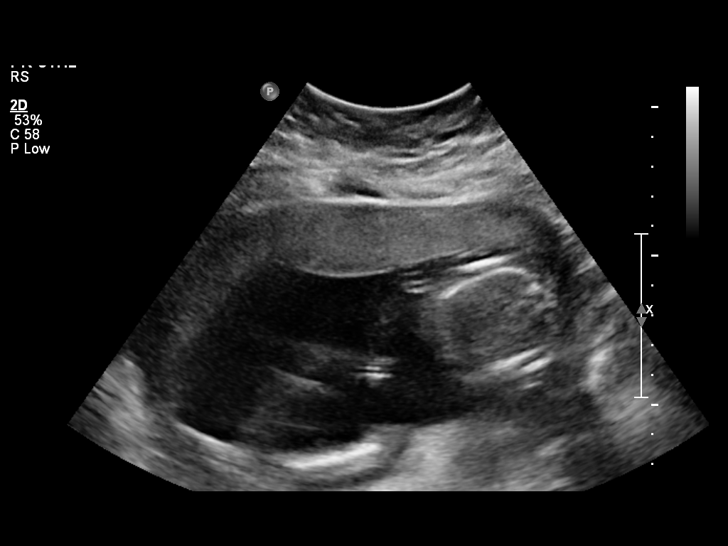
[im 12/73]
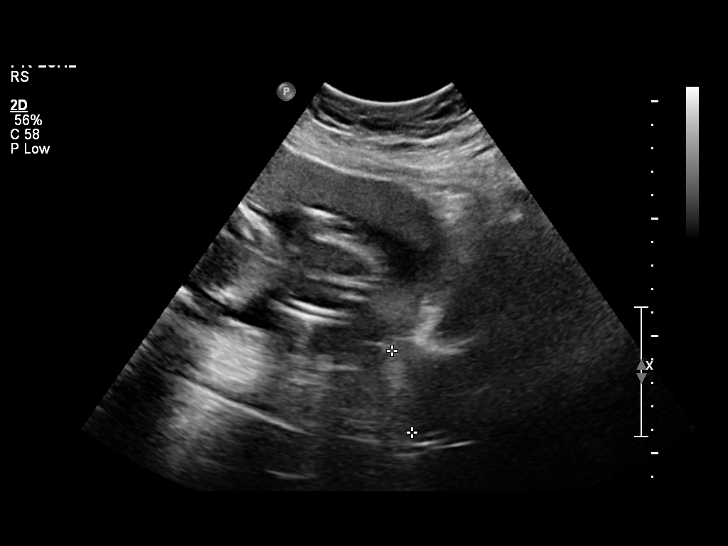
[im 20/73]
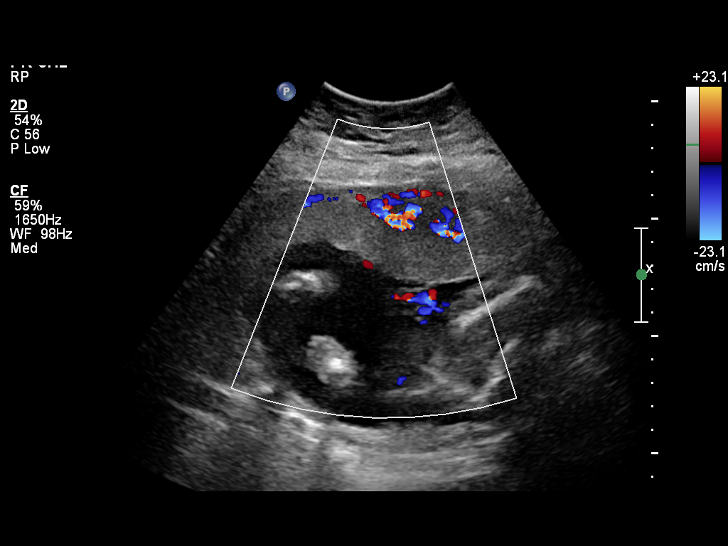
[im 25/73]
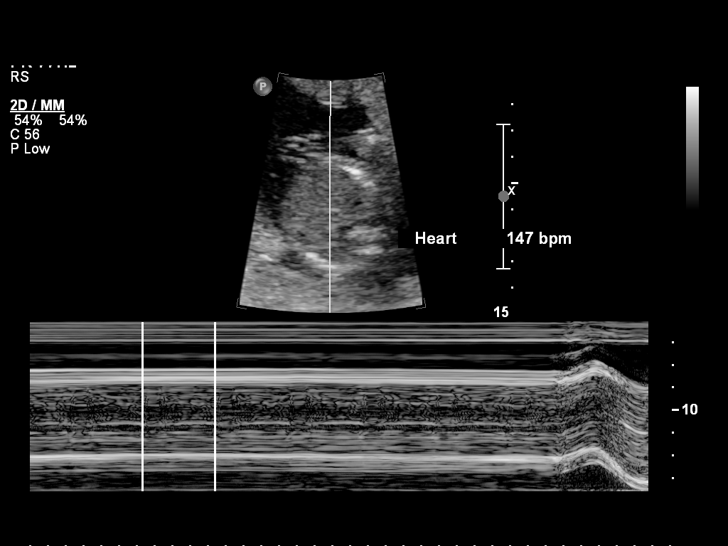
[im 31/73]
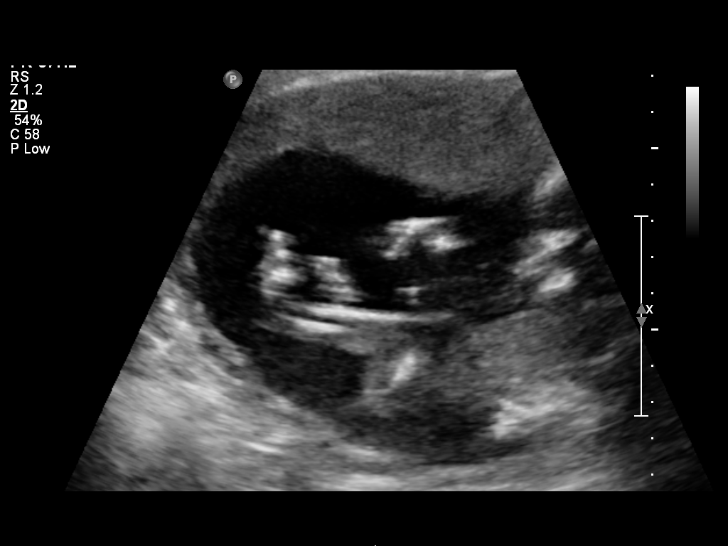
[im 39/73]
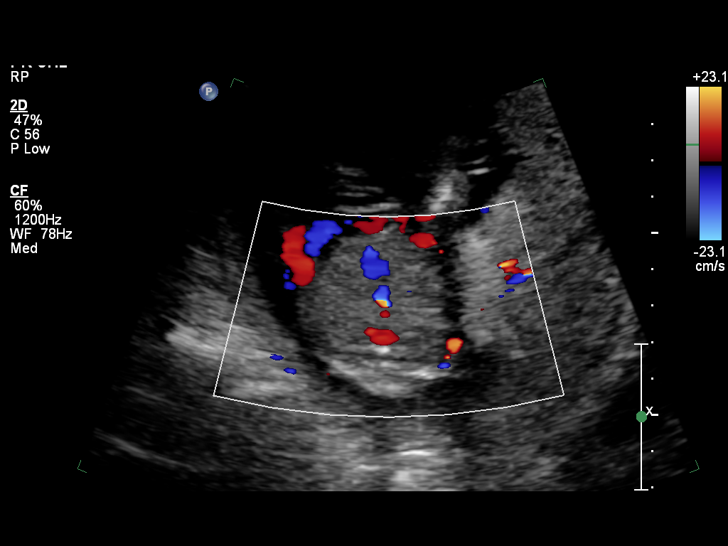
[im 45/73]
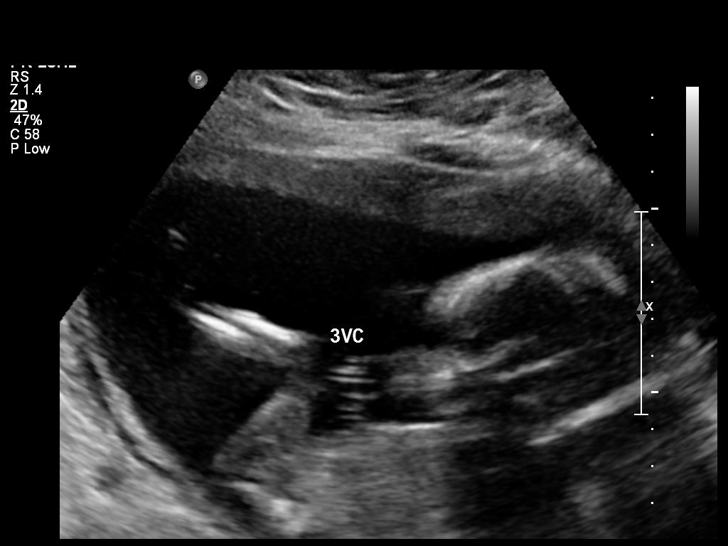
[im 50/73]
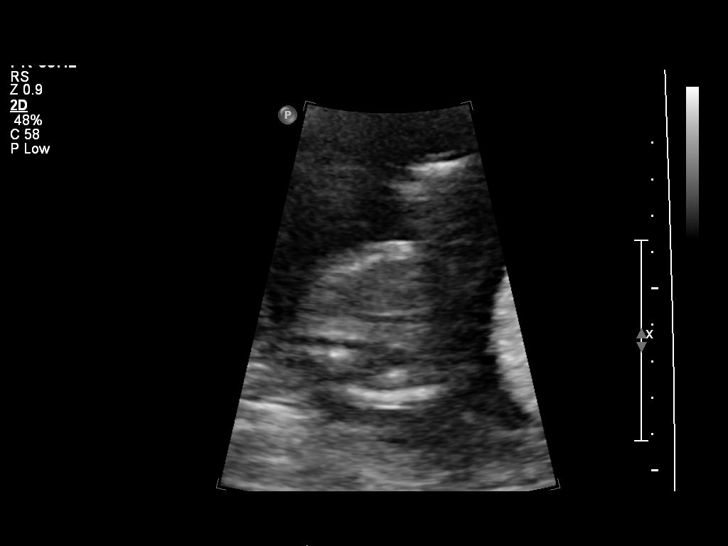
[im 59/73]
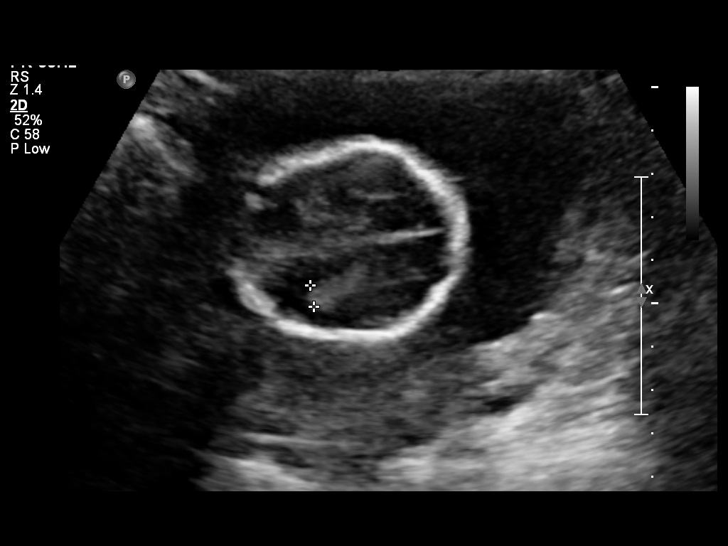
[im 64/73]
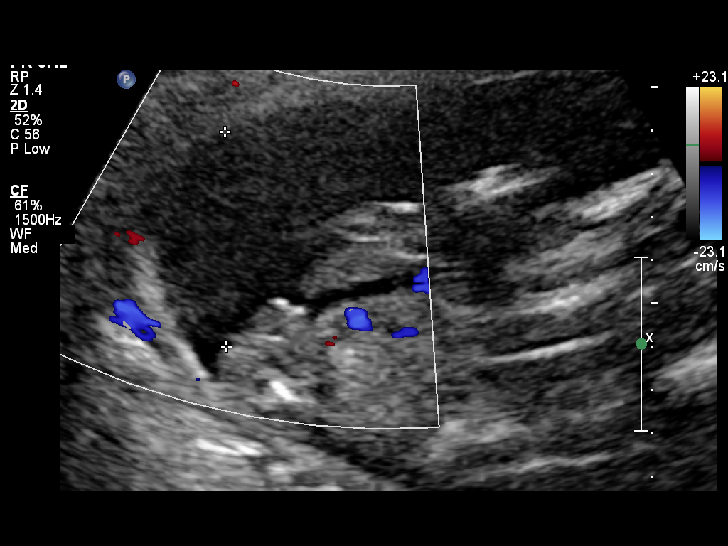
[im 70/73]
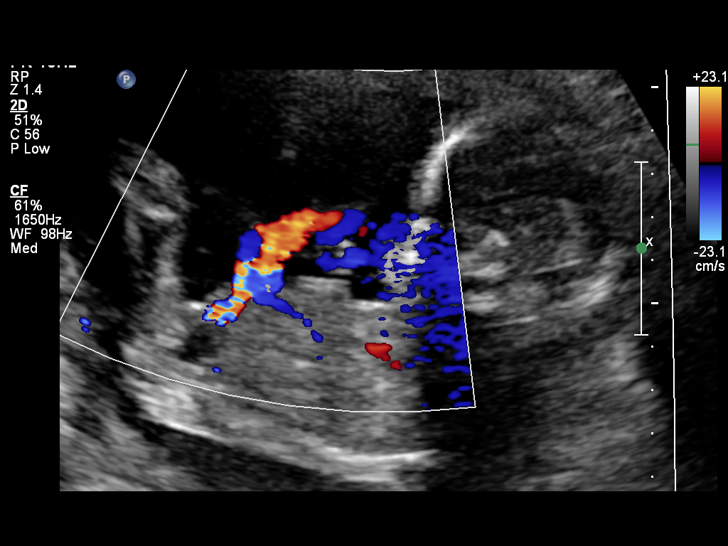

[12 of 28 positions shown; findings below may reference images not displayed]

OBSTETRICS REPORT
                      (Signed Final [DATE] [DATE])

Service(s) Provided

 US OB DETAIL + 14 WK                                  76811.0
Indications

 Detailed fetal anatomic survey                        655.83 [HA]
 Prior c-section
Fetal Evaluation

 Num Of Fetuses:    1
 Fetal Heart Rate:  153                         bpm
 Cardiac Activity:  Observed
 Presentation:      Transverse, head to
                    maternal left
 Placenta:          Anterior, above cervical os
 P. Cord            Visualized, central
 Insertion:

 Amniotic Fluid
 AFI FV:      Subjectively within normal limits
                                             Larg Pckt:       6  cm
Biometry

 BPD:     44.3  mm    G. Age:   19w 3d                CI:        76.14   70 - 86
                                                      FL/HC:      19.3   15.8 -
                                                                         18
 HC:     160.9  mm    G. Age:   18w 6d       78  %    HC/AC:      1.15   1.07 -

 AC:     140.5  mm    G. Age:   19w 3d       85  %    FL/BPD:
 FL:        31  mm    G. Age:   19w 4d       90  %    FL/AC:      22.1   20 - 24
 HUM:     30.5  mm    G. Age:   20w 1d     > 95  %
 NFT:     3.03  mm

 Est. FW:     293  gm    0 lb 10 oz      69  %
Gestational Age

 LMP:           19w 6d       Date:   [DATE]                 EDD:   [DATE]
 U/S Today:     19w 2d                                        EDD:   [DATE]
 Best:          18w 1d    Det. By:   U/S C R L ([DATE])     EDD:   [DATE]
Anatomy
 Cranium:          Appears normal         Aortic Arch:      Not well visualized
 Fetal Cavum:      Appears normal         Ductal Arch:      Appears normal
 Ventricles:       Appears normal         Diaphragm:        Not well visualized
 Choroid Plexus:   Appears normal         Stomach:          Appears normal, left
                                                            sided
 Cerebellum:       Appears normal         Abdomen:          Appears normal
 Posterior Fossa:  Appears normal         Abdominal Wall:   Appears nml (cord
                                                            insert, abd wall)
 Nuchal Fold:      Appears normal         Cord Vessels:     Appears normal (3
                                                            vessel cord)
 Face:             Profile appears        Kidneys:          Not well visualized
                   normal
 Lips:             Not well visualized    Bladder:          Appears normal
 Heart:            Not well visualized    Spine:            Not well visualized
 RVOT:             Not well visualized    Lower             Appears normal
                                          Extremities:
 LVOT:             Not well visualized    Upper             Appears normal
                                          Extremities:

 Other:  Male gender. Parents do not wish to know sex of fetus. Nasal bone
         visualized.Heels visualized. Technically difficult due to maternal
         habitus and fetal position.
Cervix Uterus Adnexa

 Cervical Length:   3.5       cm

 Cervix:       Normal appearance by transabdominal scan.
 Uterus:       No abnormality visualized.
 Left Ovary:   No adnexal mass visualized.
 Right Ovary:  No adnexal mass visualized.
 Adnexa:     No abnormality visualized.
Impression

 Single living IUP with assigned GA of 18w 1d. Technically
 difficult exam due to maternal habitus and fetal position.
 The visualized fetal anatomy appears normal.
 The fetal heart, outflow tracts, aortic arch, diaphragm,
 kidneys, and spine are not well visualized.
 Normal amniotic fluid volume and cervical length.
Recommendations

 Repeat ultrasound in 3 to 4 weeks could be considered for
 hopeful improved visualization of the fetal anatomy not well
 seen today.

## 2012-02-22 ENCOUNTER — Encounter: Payer: Self-pay | Admitting: Obstetrics and Gynecology

## 2012-02-27 ENCOUNTER — Ambulatory Visit (INDEPENDENT_AMBULATORY_CARE_PROVIDER_SITE_OTHER): Payer: BC Managed Care – PPO | Admitting: Obstetrics & Gynecology

## 2012-02-27 VITALS — BP 109/77 | Wt 273.2 lb

## 2012-02-27 DIAGNOSIS — O34219 Maternal care for unspecified type scar from previous cesarean delivery: Secondary | ICD-10-CM

## 2012-02-27 DIAGNOSIS — E669 Obesity, unspecified: Secondary | ICD-10-CM

## 2012-02-27 DIAGNOSIS — Z23 Encounter for immunization: Secondary | ICD-10-CM

## 2012-02-27 DIAGNOSIS — Z3480 Encounter for supervision of other normal pregnancy, unspecified trimester: Secondary | ICD-10-CM

## 2012-02-27 DIAGNOSIS — O99210 Obesity complicating pregnancy, unspecified trimester: Secondary | ICD-10-CM

## 2012-02-27 DIAGNOSIS — O360990 Maternal care for other rhesus isoimmunization, unspecified trimester, not applicable or unspecified: Secondary | ICD-10-CM

## 2012-02-27 DIAGNOSIS — O26899 Other specified pregnancy related conditions, unspecified trimester: Secondary | ICD-10-CM | POA: Insufficient documentation

## 2012-02-27 MED ORDER — INFLUENZA VIRUS VACC SPLIT PF IM SUSP: 0.5000 mL | Freq: Once | INTRAMUSCULAR | Status: DC

## 2012-02-27 NOTE — Patient Instructions (Signed)
Vaginal Birth After Cesarean Delivery  Vaginal birth after Cesarean delivery (VBAC) is giving birth vaginally after previously delivering a baby by a cesarean. In the past, if a woman had a Cesarean delivery, all births afterwards would be done by Cesarean delivery. This is no longer true. It can be safe for the mother to try a vaginal delivery after having a Cesarean. The final decision to have a VBAC or repeat Cesarean delivery should be between the patient and her caregiver. The risks and benefits can be discussed relative to the reason for, and the type of the previous Cesarean delivery.  WOMEN WHO PLAN TO HAVE A VBAC SHOULD CHECK WITH THEIR DOCTOR TO BE SURE THAT:  · The previous Cesarean was done with a low transverse uterine incision (not a vertical classical incision).  · The birth canal is big enough for the baby.  · There were no other operations on the uterus.  · They will have an electronic fetal monitor (EFM) on at all times during labor.  · An operating room would be available and ready in case an emergency Cesarean is needed.  · A doctor and surgical nursing staff would be available at all times during labor to be ready to do an emergency Cesarean if necessary.  · An anesthesiologist would be present in case an emergency Cesarean is needed.  · The nursery is prepared and has adequate personnel and necessary equipment available to care for the baby in case of an emergency Cesarean.  BENEFITS OF VBAC:  · Shorter stay in the hospital.  · Lower delivery, nursery and hospital costs.  · Less blood loss and need for blood transfusions.  · Less fever and discomfort from major surgery.  · Lower risk of blood clots.  · Lower risk of infection.  · Shorter recovery after going home.  · Lower risk of other surgical complications, such as opening of the incision or hernia in the incision.  · Decreased risk of injury to other organs.  · Decreased risk for having to remove the uterus (hysterectomy).  · Decreased risk  for the placenta to completely or partially cover the opening of the uterus (placenta previa) with a future pregnancy.  · Ability to have a larger family if desired.  RISKS OF A VBAC:  · Rupture of the uterus.  · Having to remove the uterus (hysterectomy) if it ruptures.  · All the complications of major surgery and/or injury to other organs.  · Excessive bleeding, blood clots and infection.  · Lower Apgar scores (method to evaluate the newborn based on appearance, pulse, grimace, activity, and respiration) and more risks to the baby.  · There is a higher risk of uterine rupture if you induce or augment labor.  · There is a higher risk of uterine rupture if you use medications to ripen the cervix.  VBAC SHOULD NOT BE DONE IF:  · The previous Cesarean was done with a vertical (classical) or T-shaped incision, or you do not know what kind of an incision was made.  · You had a ruptured uterus.  · You had surgery on your uterus.  · You have medical or obstetrical problems.  · There are problems with the baby.  · There were two previous Cesarean deliveries and no vaginal deliveries.  OTHER FACTS TO KNOW ABOUT VBAC:  · It is safe to have an epidural anesthetic with VBAC.  · It is safe to turn the baby from a breech position (attempt an external   cephalic version).  · It is safe to try a VBAC with twins.  · Pregnancies later than 40 weeks have not been successful with VBAC.  · There is an increased failure rate of a VBAC in obese pregnant women.  · There is an increased failure rate with VABC if the baby weighs 8.8 pounds (4000 grams) or more.  · There is an increased failure rate if the time between the Cesarean and VBAC is less than 19 months.  · There is an increased failure rate if pre-eclampsia is present (high blood pressure, protein in the urine and swelling of face and extremities).  · VBAC is very successful if there was a previous vaginal birth.  · VBAC is very successful when the labor starts spontaneously before  the due date.  · Delivery of VBAC is similar to having a normal spontaneous vaginal delivery.  It is important to discuss VBAC with your caregiver early in the pregnancy so you can understand the risks, benefits and options. It will give you time to decide what is best in your particular case relevant to the reason for your previous Cesarean delivery. It should be understood that medical changes in the mother or pregnancy may occur during the pregnancy, which make it necessary to change you or your caregiver's initial decision. The counseling, concerns and decisions should be documented in the medical record and signed by all parties.  Document Released: 09/17/2006 Document Revised: 06/19/2011 Document Reviewed: 05/08/2008  [employer]® Patient Information ©2013 [employer].

## 2012-02-27 NOTE — Progress Notes (Signed)
Doing well on Prilosec. Incomplete anatomy scan, next scan in 3-4 weeks. Discussed VBAC vs RCS, will give form to review and will make decision at later visit. No other complaints or concerns.  Obstetric precautions reviewed. Flu shot today.

## 2012-02-27 NOTE — Progress Notes (Signed)
P-84  

## 2012-03-11 ENCOUNTER — Inpatient Hospital Stay (HOSPITAL_COMMUNITY)
Admission: AD | Admit: 2012-03-11 | Discharge: 2012-03-11 | Disposition: A | Discharge status: Home / Self Care | Payer: BC Managed Care – PPO | Source: Ambulatory Visit | Attending: Obstetrics & Gynecology | Admitting: Obstetrics & Gynecology

## 2012-03-11 ENCOUNTER — Encounter (HOSPITAL_COMMUNITY): Payer: Self-pay | Admitting: Family

## 2012-03-11 ENCOUNTER — Ambulatory Visit (INDEPENDENT_AMBULATORY_CARE_PROVIDER_SITE_OTHER): Payer: BC Managed Care – PPO | Admitting: *Deleted

## 2012-03-11 DIAGNOSIS — W19XXXA Unspecified fall, initial encounter: Secondary | ICD-10-CM

## 2012-03-11 DIAGNOSIS — O360990 Maternal care for other rhesus isoimmunization, unspecified trimester, not applicable or unspecified: Secondary | ICD-10-CM

## 2012-03-11 DIAGNOSIS — O4690 Antepartum hemorrhage, unspecified, unspecified trimester: Secondary | ICD-10-CM

## 2012-03-11 DIAGNOSIS — O26899 Other specified pregnancy related conditions, unspecified trimester: Secondary | ICD-10-CM

## 2012-03-11 DIAGNOSIS — O99891 Other specified diseases and conditions complicating pregnancy: Secondary | ICD-10-CM | POA: Insufficient documentation

## 2012-03-11 DIAGNOSIS — O99210 Obesity complicating pregnancy, unspecified trimester: Secondary | ICD-10-CM

## 2012-03-11 DIAGNOSIS — R109 Unspecified abdominal pain: Secondary | ICD-10-CM | POA: Insufficient documentation

## 2012-03-11 DIAGNOSIS — O26859 Spotting complicating pregnancy, unspecified trimester: Secondary | ICD-10-CM | POA: Insufficient documentation

## 2012-03-11 DIAGNOSIS — O361190 Maternal care for Anti-A sensitization, unspecified trimester, not applicable or unspecified: Secondary | ICD-10-CM

## 2012-03-11 DIAGNOSIS — R296 Repeated falls: Secondary | ICD-10-CM | POA: Insufficient documentation

## 2012-03-11 MED ORDER — RHO D IMMUNE GLOBULIN 1500 UNIT/2ML IJ SOLN
300.0000 ug | Freq: Once | INTRAMUSCULAR | Status: AC
  Administered 2012-03-11: 300 ug via INTRAMUSCULAR
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
## 2012-03-25 ENCOUNTER — Encounter: Payer: BC Managed Care – PPO | Admitting: Obstetrics & Gynecology

## 2012-03-26 ENCOUNTER — Ambulatory Visit (HOSPITAL_COMMUNITY)
Admission: RE | Admit: 2012-03-26 | Discharge: 2012-03-26 | Disposition: A | Discharge status: Home / Self Care | Payer: BC Managed Care – PPO | Source: Ambulatory Visit | Attending: Obstetrics & Gynecology | Admitting: Obstetrics & Gynecology

## 2012-03-26 DIAGNOSIS — Z3480 Encounter for supervision of other normal pregnancy, unspecified trimester: Secondary | ICD-10-CM

## 2012-03-26 DIAGNOSIS — Z3689 Encounter for other specified antenatal screening: Secondary | ICD-10-CM | POA: Insufficient documentation

## 2012-03-26 DIAGNOSIS — O26899 Other specified pregnancy related conditions, unspecified trimester: Secondary | ICD-10-CM

## 2012-03-26 DIAGNOSIS — O99210 Obesity complicating pregnancy, unspecified trimester: Secondary | ICD-10-CM

## 2012-03-26 IMAGING — US US OB FOLLOW-UP
2 series · 12 of 28 positions shown · non-contrast
Comparison: none

[Series 1: us ob follow up · 16 acquisitions, 3 frames shown (1 of 2)]
[im 3/16]
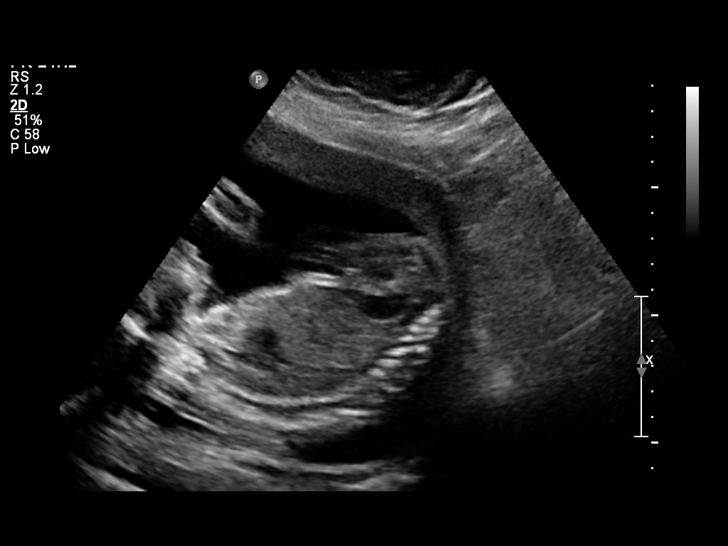
[im 7/16]
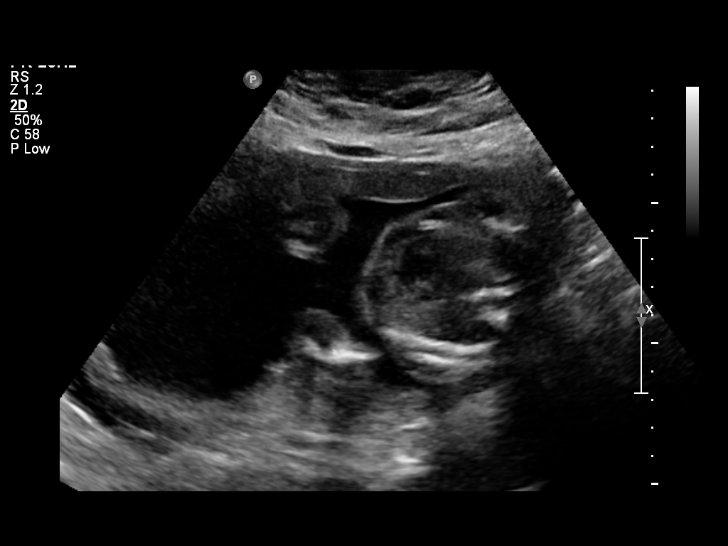
[im 11/16]
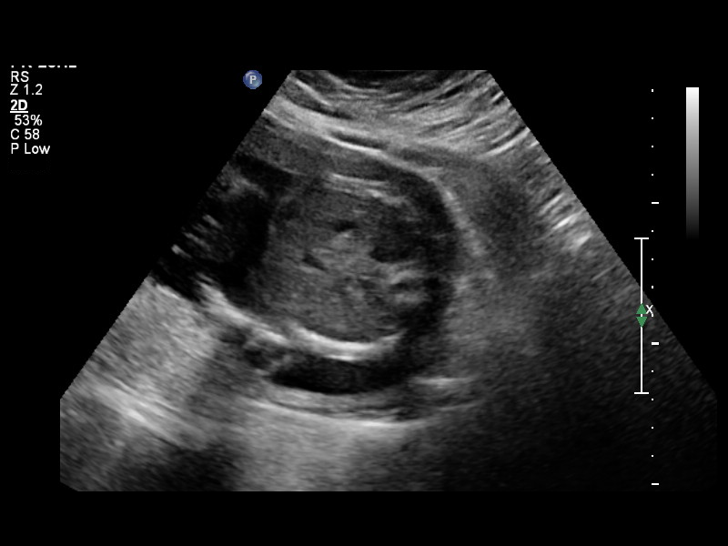

[Series 1: us ob follow up · 9 of 40 slices shown (2 of 2)]
[im 1/40]
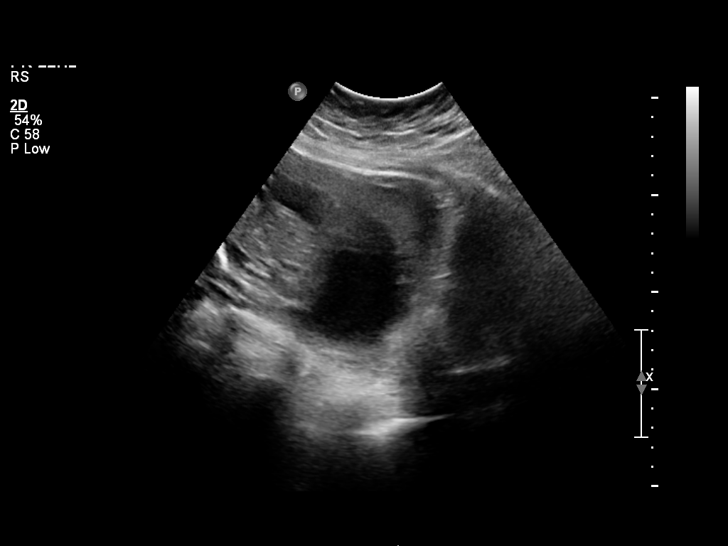
[im 5/40]
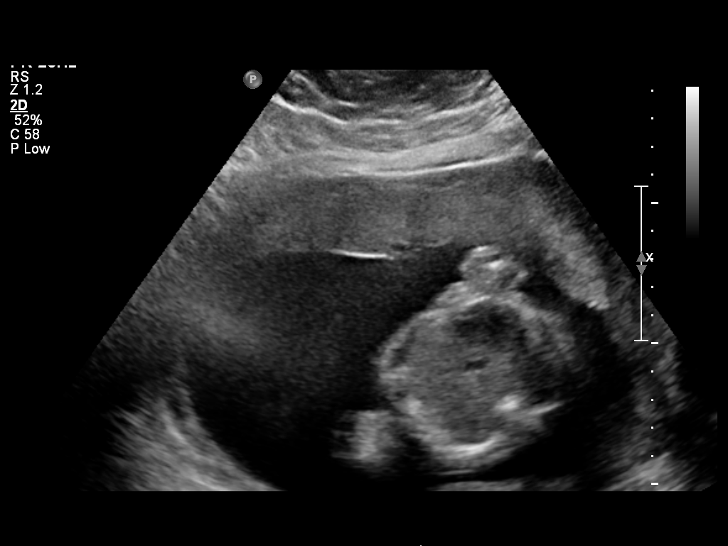
[im 9/40]
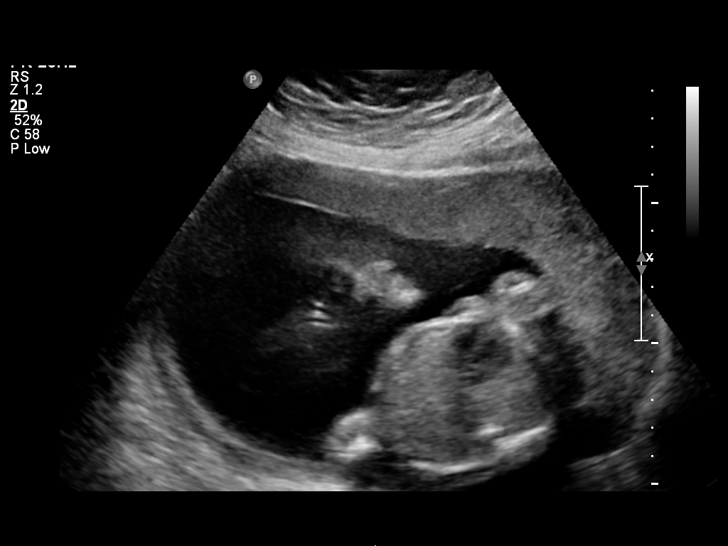
[im 15/40]
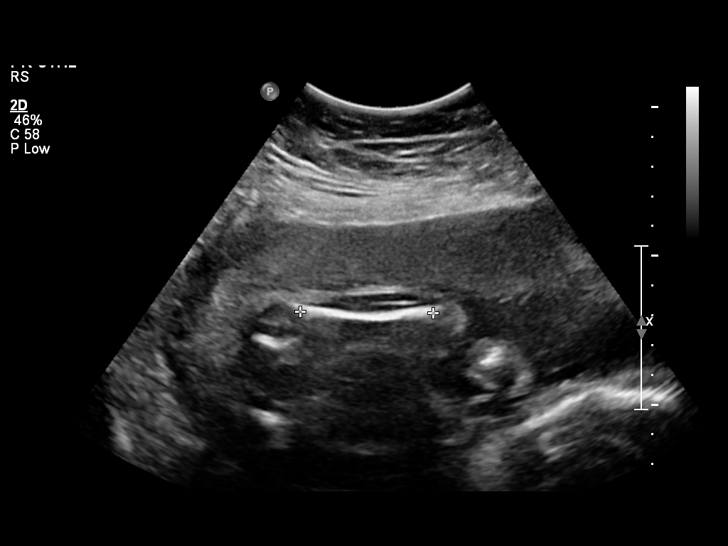
[im 19/40]
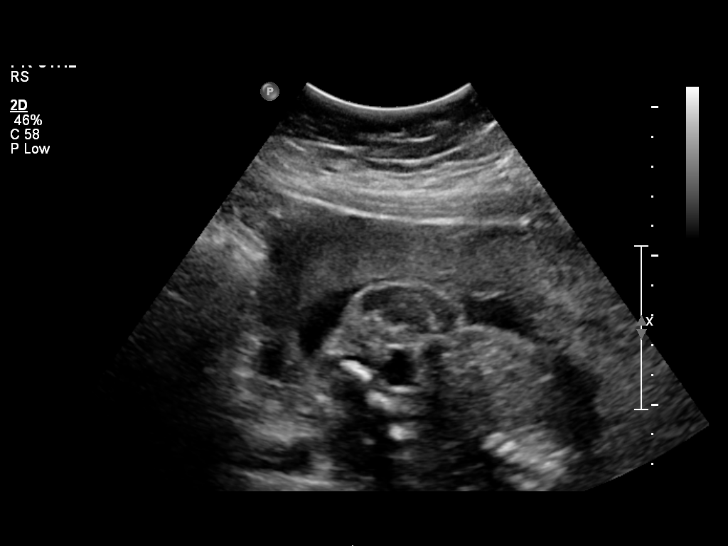
[im 23/40]
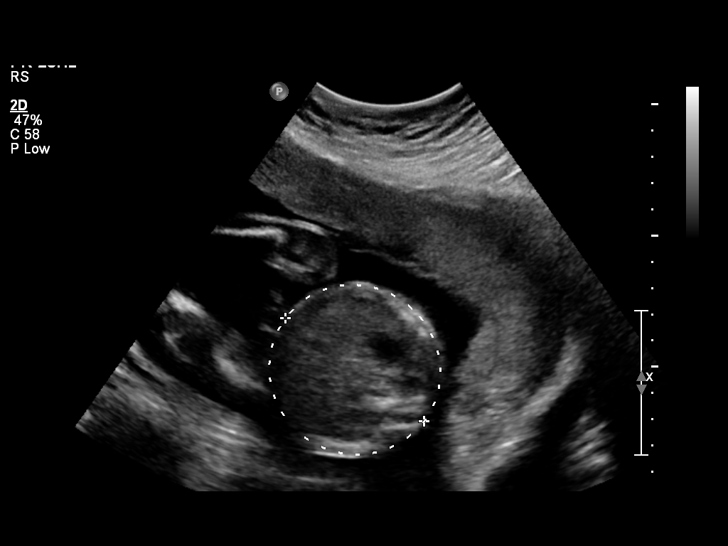
[im 29/40]
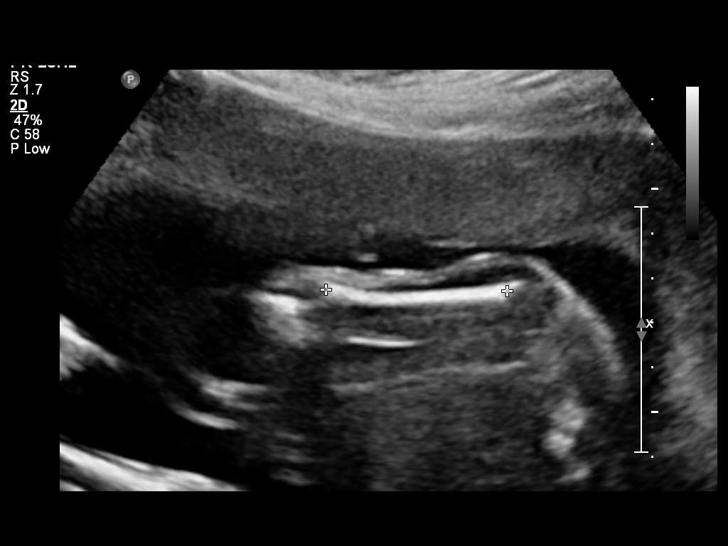
[im 33/40]
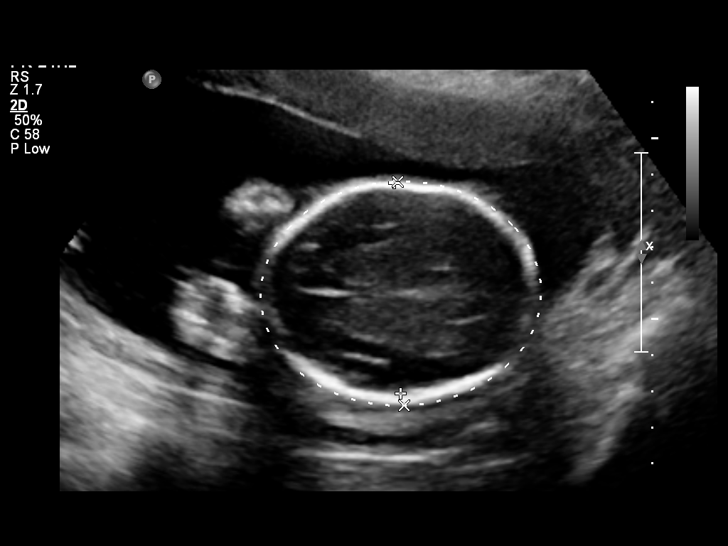
[im 37/40]
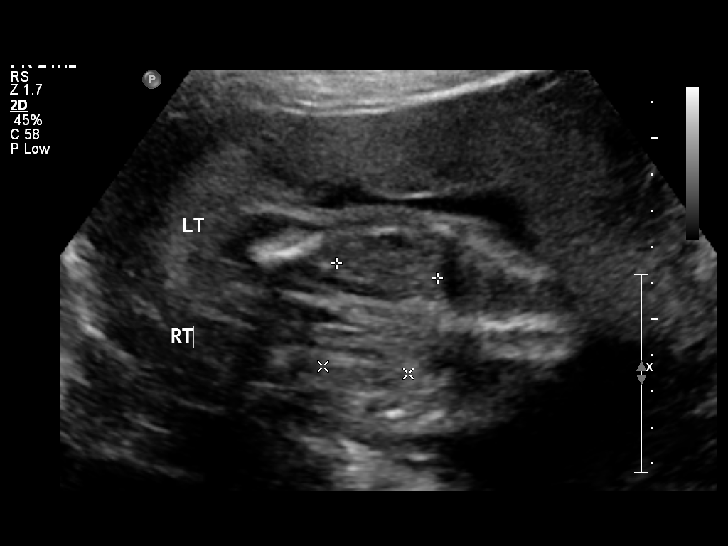

[12 of 28 positions shown; findings below may reference images not displayed]

OBSTETRICS REPORT
                      (Signed Final [DATE] [DATE])

Service(s) Provided

 US OB FOLLOW UP                                       76816.1
Indications

 Prior c-section
 Follow-up incomplete fetal anatomic evaluation        [AQ]
Fetal Evaluation

 Num Of Fetuses:    1
 Fetal Heart Rate:  140                         bpm
 Cardiac Activity:  Observed
 Presentation:      Breech
 Placenta:          Anterior, above cervical os
 P. Cord            Previously Visualized
 Insertion:

 Amniotic Fluid
 AFI FV:      Subjectively within normal limits
                                             Larg Pckt:       7  cm
Biometry

 BPD:     59.1  mm    G. Age:   24w 1d                CI:        74.16   70 - 86
                                                      FL/HC:      20.3   19.2 -

 HC:     217.9  mm    G. Age:   23w 6d       68  %    HC/AC:      1.08   1.05 -

 AC:     201.3  mm    G. Age:   24w 5d       89  %    FL/BPD:     75.0   71 - 87
 FL:      44.3  mm    G. Age:   24w 4d       84  %    FL/AC:      22.0   20 - 24
 HUM:     41.9  mm    G. Age:   25w 2d       94  %

 Est. FW:     711  gm      1 lb 9 oz     76  %
Gestational Age

 LMP:           24w 5d       Date:   [DATE]                 EDD:   [DATE]
 U/S Today:     24w 2d                                        EDD:   [DATE]
 Best:          23w 0d    Det. By:   U/S C R L ([DATE])     EDD:   [DATE]
Anatomy

 Cranium:          Appears normal         Aortic Arch:      Not well visualized
 Fetal Cavum:      Previously seen        Ductal Arch:      Appears normal
 Ventricles:       Appears normal         Diaphragm:        Appears normal
 Choroid Plexus:   Previously seen        Stomach:          Appears normal, left
                                                            sided
 Cerebellum:       Previously seen        Abdomen:          Appears normal
 Posterior Fossa:  Previously seen        Abdominal Wall:   Previously seen
 Nuchal Fold:      Previously seen        Cord Vessels:     Previously seen
 Face:             Profile appears        Kidneys:          Appear normal
                   normal
 Lips:             Appears normal         Bladder:          Appears normal
 Heart:            Appears normal         Spine:            Limited views
                   (4CH, axis, and                          appear normal
                   situs)
 RVOT:             Not well visualized    Lower             Appears normal
                                          Extremities:
 LVOT:             Appears normal         Upper             Appears normal
                                          Extremities:

 Other:  Male gender. Parents do not wish to know sex of fetus. Nasal bone
         visualized.Heels visualized. Technically difficult due to maternal
         habitus and fetal position.
Cervix Uterus Adnexa

 Cervical Length:   3.6       cm

 Cervix:       Normal appearance by transabdominal scan.
 Uterus:       No abnormality visualized.
 Left Ovary:   No adnexal mass visualized.
 Right Ovary:  No adnexal mass visualized.
 Adnexa:     No abnormality visualized.
Impression

 Single living intrauterine pregnancy in breech presentation.
 The estimated gestational age is 23w 0d based on U/S C R L
 ([DATE]). Estimated fetal weight is 711g,  76th percentile for
 gestational age of   23w 0d. No fetal anomalies are identified.

## 2012-03-27 ENCOUNTER — Encounter: Payer: Self-pay | Admitting: Obstetrics & Gynecology

## 2012-04-01 ENCOUNTER — Ambulatory Visit (INDEPENDENT_AMBULATORY_CARE_PROVIDER_SITE_OTHER): Payer: BC Managed Care – PPO | Admitting: Family Medicine

## 2012-04-01 VITALS — BP 114/75 | Wt 278.0 lb

## 2012-04-01 DIAGNOSIS — Z3480 Encounter for supervision of other normal pregnancy, unspecified trimester: Secondary | ICD-10-CM

## 2012-04-01 NOTE — Progress Notes (Signed)
Leaning toward RCS--if labor happens before ok for TOLAC, but no IOL. Anatomy ok

## 2012-04-01 NOTE — Assessment & Plan Note (Signed)
Doing well 

## 2012-04-22 ENCOUNTER — Ambulatory Visit (INDEPENDENT_AMBULATORY_CARE_PROVIDER_SITE_OTHER): Payer: BC Managed Care – PPO | Admitting: Obstetrics & Gynecology

## 2012-04-22 ENCOUNTER — Encounter: Payer: Self-pay | Admitting: Obstetrics & Gynecology

## 2012-04-22 VITALS — BP 103/76 | Wt 283.0 lb

## 2012-04-22 DIAGNOSIS — O99210 Obesity complicating pregnancy, unspecified trimester: Secondary | ICD-10-CM

## 2012-04-22 DIAGNOSIS — O34219 Maternal care for unspecified type scar from previous cesarean delivery: Secondary | ICD-10-CM

## 2012-04-22 DIAGNOSIS — Z3480 Encounter for supervision of other normal pregnancy, unspecified trimester: Secondary | ICD-10-CM

## 2012-04-22 DIAGNOSIS — Z23 Encounter for immunization: Secondary | ICD-10-CM

## 2012-04-22 DIAGNOSIS — E669 Obesity, unspecified: Secondary | ICD-10-CM

## 2012-04-22 DIAGNOSIS — Z6791 Unspecified blood type, Rh negative: Secondary | ICD-10-CM

## 2012-04-22 DIAGNOSIS — O360990 Maternal care for other rhesus isoimmunization, unspecified trimester, not applicable or unspecified: Secondary | ICD-10-CM

## 2012-04-22 LAB — CBC
Hemoglobin: 10.9 g/dL — ABNORMAL LOW (ref 12.0–15.0)
RBC: 3.65 MIL/uL — ABNORMAL LOW (ref 3.87–5.11)

## 2012-04-22 NOTE — Progress Notes (Signed)
Routine visit. 28 week labs and glucola. Good FM. No problems.

## 2012-04-22 NOTE — Progress Notes (Signed)
Routine visit. Doing second glucola today +labs. TDAP today. I will repeat her rhogam in 2 weeks (She had a dose about the beginning of Dec). Good FM. No OB problems.

## 2012-04-23 ENCOUNTER — Encounter: Payer: Self-pay | Admitting: Obstetrics & Gynecology

## 2012-04-23 LAB — HIV ANTIBODY (ROUTINE TESTING W REFLEX): HIV: NONREACTIVE

## 2012-04-23 LAB — RPR

## 2012-05-06 ENCOUNTER — Ambulatory Visit (INDEPENDENT_AMBULATORY_CARE_PROVIDER_SITE_OTHER): Payer: BC Managed Care – PPO | Admitting: Family Medicine

## 2012-05-06 VITALS — BP 131/68 | Wt 281.0 lb

## 2012-05-06 DIAGNOSIS — O360990 Maternal care for other rhesus isoimmunization, unspecified trimester, not applicable or unspecified: Secondary | ICD-10-CM

## 2012-05-06 DIAGNOSIS — O34219 Maternal care for unspecified type scar from previous cesarean delivery: Secondary | ICD-10-CM

## 2012-05-06 DIAGNOSIS — Z6791 Unspecified blood type, Rh negative: Secondary | ICD-10-CM

## 2012-05-06 DIAGNOSIS — Z3480 Encounter for supervision of other normal pregnancy, unspecified trimester: Secondary | ICD-10-CM

## 2012-05-06 DIAGNOSIS — O26899 Other specified pregnancy related conditions, unspecified trimester: Secondary | ICD-10-CM

## 2012-05-06 MED ORDER — RHO D IMMUNE GLOBULIN 1500 UNIT/2ML IJ SOLN
300.0000 ug | Freq: Once | INTRAMUSCULAR | Status: AC
  Administered 2012-05-06: 300 ug via INTRAMUSCULAR

## 2012-05-06 NOTE — Patient Instructions (Signed)
Pregnancy - Third Trimester The third trimester of pregnancy (the last 3 months) is a period of the most rapid growth for you and your baby. The baby approaches a length of 20 inches and a weight of 6 to 10 pounds. The baby is adding on fat and getting ready for life outside your body. While inside, babies have periods of sleeping and waking, suck their thumbs, and hiccups. You can often feel small contractions of the uterus. This is false labor. It is also called Braxton-Hicks contractions. This is like a practice for labor. The usual problems in this stage of pregnancy include more difficulty breathing, swelling of the hands and feet from water retention, and having to urinate more often because of the uterus and baby pressing on your bladder.  PRENATAL EXAMS  Blood work may continue to be done during prenatal exams. These tests are done to check on your health and the probable health of your baby. Blood work is used to follow your blood levels (hemoglobin). Anemia (low hemoglobin) is common during pregnancy. Iron and vitamins are given to help prevent this. You may also continue to be checked for diabetes. Some of the past blood tests may be done again.  The size of the uterus is measured during each visit. This makes sure your baby is growing properly according to your pregnancy dates.  Your blood pressure is checked every prenatal visit. This is to make sure you are not getting toxemia.  Your urine is checked every prenatal visit for infection, diabetes and protein.  Your weight is checked at each visit. This is done to make sure gains are happening at the suggested rate and that you and your baby are growing normally.  Sometimes, an ultrasound is performed to confirm the position and the proper growth and development of the baby. This is a test done that bounces harmless sound waves off the baby so your caregiver can more accurately determine due dates.  Discuss the type of pain medication  and anesthesia you will have during your labor and delivery.  Discuss the possibility and anesthesia if a Cesarean Section might be necessary.  Inform your caregiver if there is any mental or physical violence at home. Sometimes, a specialized non-stress test, contraction stress test and biophysical profile are done to make sure the baby is not having a problem. Checking the amniotic fluid surrounding the baby is called an amniocentesis. The amniotic fluid is removed by sticking a needle into the belly (abdomen). This is sometimes done near the end of pregnancy if an early delivery is required. In this case, it is done to help make sure the baby's lungs are mature enough for the baby to live outside of the womb. If the lungs are not mature and it is unsafe to deliver the baby, an injection of cortisone medication is given to the mother 1 to 2 days before the delivery. This helps the baby's lungs mature and makes it safer to deliver the baby. CHANGES OCCURING IN THE THIRD TRIMESTER OF PREGNANCY Your body goes through many changes during pregnancy. They vary from person to person. Talk to your caregiver about changes you notice and are concerned about.  During the last trimester, you have probably had an increase in your appetite. It is normal to have cravings for certain foods. This varies from person to person and pregnancy to pregnancy.  You may begin to get stretch marks on your hips, abdomen, and breasts. These are normal changes in the body   during pregnancy. There are no exercises or medications to take which prevent this change.  Constipation may be treated with a stool softener or adding bulk to your diet. Drinking lots of fluids, fiber in vegetables, fruits, and whole grains are helpful.  Exercising is also helpful. If you have been very active up until your pregnancy, most of these activities can be continued during your pregnancy. If you have been less active, it is helpful to start an  exercise program such as walking. Consult your caregiver before starting exercise programs.  Avoid all smoking, alcohol, un-prescribed drugs, herbs and "street drugs" during your pregnancy. These chemicals affect the formation and growth of the baby. Avoid chemicals throughout the pregnancy to ensure the delivery of a healthy infant.  Backache, varicose veins and hemorrhoids may develop or get worse.  You will tire more easily in the third trimester, which is normal.  The baby's movements may be stronger and more often.  You may become short of breath easily.  Your belly button may stick out.  A yellow discharge may leak from your breasts called colostrum.  You may have a bloody mucus discharge. This usually occurs a few days to a week before labor begins. HOME CARE INSTRUCTIONS   Keep your caregiver's appointments. Follow your caregiver's instructions regarding medication use, exercise, and diet.  During pregnancy, you are providing food for you and your baby. Continue to eat regular, well-balanced meals. Choose foods such as meat, fish, milk and other low fat dairy products, vegetables, fruits, and whole-grain breads and cereals. Your caregiver will tell you of the ideal weight gain.  A physical sexual relationship may be continued throughout pregnancy if there are no other problems such as early (premature) leaking of amniotic fluid from the membranes, vaginal bleeding, or belly (abdominal) pain.  Exercise regularly if there are no restrictions. Check with your caregiver if you are unsure of the safety of your exercises. Greater weight gain will occur in the last 2 trimesters of pregnancy. Exercising helps:  Control your weight.  Get you in shape for labor and delivery.  You lose weight after you deliver.  Rest a lot with legs elevated, or as needed for leg cramps or low back pain.  Wear a good support or jogging bra for breast tenderness during pregnancy. This may help if worn  during sleep. Pads or tissues may be used in the bra if you are leaking colostrum.  Do not use hot tubs, steam rooms, or saunas.  Wear your seat belt when driving. This protects you and your baby if you are in an accident.  Avoid raw meat, cat litter boxes and soil used by cats. These carry germs that can cause birth defects in the baby.  It is easier to loose urine during pregnancy. Tightening up and strengthening the pelvic muscles will help with this problem. You can practice stopping your urination while you are going to the bathroom. These are the same muscles you need to strengthen. It is also the muscles you would use if you were trying to stop from passing gas. You can practice tightening these muscles up 10 times a set and repeating this about 3 times per day. Once you know what muscles to tighten up, do not perform these exercises during urination. It is more likely to cause an infection by backing up the urine.  Ask for help if you have financial, counseling or nutritional needs during pregnancy. Your caregiver will be able to offer counseling for these   needs as well as refer you for other special needs.  Make a list of emergency phone numbers and have them available.  Plan on getting help from family or friends when you go home from the hospital.  Make a trial run to the hospital.  Take prenatal classes with the father to understand, practice and ask questions about the labor and delivery.  Prepare the baby's room/nursery.  Do not travel out of the city unless it is absolutely necessary and with the advice of your caregiver.  Wear only low or no heal shoes to have better balance and prevent falling. MEDICATIONS AND DRUG USE IN PREGNANCY  Take prenatal vitamins as directed. The vitamin should contain 1 milligram of folic acid. Keep all vitamins out of reach of children. Only a couple vitamins or tablets containing iron may be fatal to a baby or young child when  ingested.  Avoid use of all medications, including herbs, over-the-counter medications, not prescribed or suggested by your caregiver. Only take over-the-counter or prescription medicines for pain, discomfort, or fever as directed by your caregiver. Do not use aspirin, ibuprofen (Motrin, Advil, Nuprin) or naproxen (Aleve) unless OK'd by your caregiver.  Let your caregiver also know about herbs you may be using.  Alcohol is related to a number of birth defects. This includes fetal alcohol syndrome. All alcohol, in any form, should be avoided completely. Smoking will cause low birth rate and premature babies.  Street/illegal drugs are very harmful to the baby. They are absolutely forbidden. A baby born to an addicted mother will be addicted at birth. The baby will go through the same withdrawal an adult does. SEEK MEDICAL CARE IF: You have any concerns or worries during your pregnancy. It is better to call with your questions if you feel they cannot wait, rather than worry about them. DECISIONS ABOUT CIRCUMCISION You may or may not know the sex of your baby. If you know your baby is a boy, it may be time to think about circumcision. Circumcision is the removal of the foreskin of the penis. This is the skin that covers the sensitive end of the penis. There is no proven medical need for this. Often this decision is made on what is popular at the time or based upon religious beliefs and social issues. You can discuss these issues with your caregiver or pediatrician. SEEK IMMEDIATE MEDICAL CARE IF:   An unexplained oral temperature above 102 F (38.9 C) develops, or as your caregiver suggests.  You have leaking of fluid from the vagina (birth canal). If leaking membranes are suspected, take your temperature and tell your caregiver of this when you call.  There is vaginal spotting, bleeding or passing clots. Tell your caregiver of the amount and how many pads are used.  You develop a bad smelling  vaginal discharge with a change in the color from clear to white.  You develop vomiting that lasts more than 24 hours.  You develop chills or fever.  You develop shortness of breath.  You develop burning on urination.  You loose more than 2 pounds of weight or gain more than 2 pounds of weight or as suggested by your caregiver.  You notice sudden swelling of your face, hands, and feet or legs.  You develop belly (abdominal) pain. Round ligament discomfort is a common non-cancerous (benign) cause of abdominal pain in pregnancy. Your caregiver still must evaluate you.  You develop a severe headache that does not go away.  You develop visual   problems, blurred or double vision.  If you have not felt your baby move for more than 1 hour. If you think the baby is not moving as much as usual, eat something with sugar in it and lie down on your left side for an hour. The baby should move at least 4 to 5 times per hour. Call right away if your baby moves less than that.  You fall, are in a car accident or any kind of trauma.  There is mental or physical violence at home. Document Released: 03/21/2001 Document Revised: 06/19/2011 Document Reviewed: 09/23/2008 [employer] Patient Information 2013 [employer].  Breastfeeding Deciding to breastfeed is one of the best choices you can make for you and your baby. The information that follows gives a brief overview of the benefits of breastfeeding as well as common topics surrounding breastfeeding. BENEFITS OF BREASTFEEDING For the baby  The first milk (colostrum) helps the baby's digestive system function better.   There are antibodies in the mother's milk that help the baby fight off infections.   The baby has a lower incidence of asthma, allergies, and sudden infant death syndrome (SIDS).   The nutrients in breast milk are better for the baby than infant formulas, and breast milk helps the baby's brain grow better.   Babies who  breastfeed have less gas, colic, and constipation.  For the mother  Breastfeeding helps develop a very special bond between the mother and her baby.   Breastfeeding is convenient, always available at the correct temperature, and costs nothing.   Breastfeeding burns calories in the mother and helps her lose weight that was gained during pregnancy.   Breastfeeding makes the uterus contract back down to normal size faster and slows bleeding following delivery.   Breastfeeding mothers have a lower risk of developing breast cancer.  BREASTFEEDING FREQUENCY  A healthy, full-term baby may breastfeed as often as every hour or space his or her feedings to every 3 hours.   Watch your baby for signs of hunger. Nurse your baby if he or she shows signs of hunger. How often you nurse will vary from baby to baby.   Nurse as often as the baby requests, or when you feel the need to reduce the fullness of your breasts.   Awaken the baby if it has been 3 4 hours since the last feeding.   Frequent feeding will help the mother make more milk and will help prevent problems, such as sore nipples and engorgement of the breasts.  BABY'S POSITION AT THE BREAST  Whether lying down or sitting, be sure that the baby's tummy is facing your tummy.   Support the breast with 4 fingers underneath the breast and the thumb above. Make sure your fingers are well away from the nipple and baby's mouth.   Stroke the baby's lips gently with your finger or nipple.   When the baby's mouth is open wide enough, place all of your nipple and as much of the areola as possible into your baby's mouth.   Pull the baby in close so the tip of the nose and the baby's cheeks touch the breast during the feeding.  FEEDINGS AND SUCTION  The length of each feeding varies from baby to baby and from feeding to feeding.   The baby must suck about 2 3 minutes for your milk to get to him or her. This is called a "let down."  For this reason, allow the baby to feed on each breast as   long as he or she wants. Your baby will end the feeding when he or she has received the right balance of nutrients.   To break the suction, put your finger into the corner of the baby's mouth and slide it between his or her gums before removing your breast from his or her mouth. This will help prevent sore nipples.  HOW TO TELL WHETHER YOUR BABY IS GETTING ENOUGH BREAST MILK. Wondering whether or not your baby is getting enough milk is a common concern among mothers. You can be assured that your baby is getting enough milk if:   Your baby is actively sucking and you hear swallowing.   Your baby seems relaxed and satisfied after a feeding.   Your baby nurses at least 8 12 times in a 24 hour time period. Nurse your baby until he or she unlatches or falls asleep at the first breast (at least 10 20 minutes), then offer the second side.   Your baby is wetting 5 6 disposable diapers (6 8 cloth diapers) in a 24 hour period by 5 6 days of age.   Your baby is having at least 3 4 stools every 24 hours for the first 6 weeks. The stool should be soft and yellow.   Your baby should gain 4 7 ounces per week after he or she is 4 days old.   Your breasts feel softer after nursing.  REDUCING BREAST ENGORGEMENT  In the first week after your baby is born, you may experience signs of breast engorgement. When breasts are engorged, they feel heavy, warm, full, and may be tender to the touch. You can reduce engorgement if you:   Nurse frequently, every 2 3 hours. Mothers who breastfeed early and often have fewer problems with engorgement.   Place light ice packs on your breasts for 10 20 minutes between feedings. This reduces swelling. Wrap the ice packs in a lightweight towel to protect your skin. Bags of frozen vegetables work well for this purpose.   Take a warm shower or apply warm, moist heat to your breast for 5 10 minutes just before  each feeding. This increases circulation and helps the milk flow.   Gently massage your breast before and during the feeding. Using your finger tips, massage from the chest wall towards your nipple in a circular motion.   Make sure that the baby empties at least one breast at every feeding before switching sides.   Use a breast pump to empty the breasts if your baby is sleepy or not nursing well. You may also want to pump if you are returning to work oryou feel you are getting engorged.   Avoid bottle feeds, pacifiers, or supplemental feedings of water or juice in place of breastfeeding. Breast milk is all the food your baby needs. It is not necessary for your baby to have water or formula. In fact, to help your breasts make more milk, it is best not to give your baby supplemental feedings during the early weeks.   Be sure the baby is latched on and positioned properly while breastfeeding.   Wear a supportive bra, avoiding underwire styles.   Eat a balanced diet with enough fluids.   Rest often, relax, and take your prenatal vitamins to prevent fatigue, stress, and anemia.  If you follow these suggestions, your engorgement should improve in 24 48 hours. If you are still experiencing difficulty, call your lactation consultant or caregiver.  CARING FOR YOURSELF Take care of your   breasts  Bathe or shower daily.   Avoid using soap on your nipples.   Start feedings on your left breast at one feeding and on your right breast at the next feeding.   You will notice an increase in your milk supply 2 5 days after delivery. You may feel some discomfort from engorgement, which makes your breasts very firm and often tender. Engorgement "peaks" out within 24 48 hours. In the meantime, apply warm moist towels to your breasts for 5 10 minutes before feeding. Gentle massage and expression of some milk before feeding will soften your breasts, making it easier for your baby to latch on.    Wear a well-fitting nursing bra, and air dry your nipples for a 3 4minutes after each feeding.   Only use cotton bra pads.   Only use pure lanolin on your nipples after nursing. You do not need to wash it off before feeding the baby again. Another option is to express a few drops of breast milk and gently massage it into your nipples.  Take care of yourself  Eat well-balanced meals and nutritious snacks.   Drinking milk, fruit juice, and water to satisfy your thirst (about 8 glasses a day).   Get plenty of rest.  Avoid foods that you notice affect the baby in a bad way.  SEEK MEDICAL CARE IF:   You have difficulty with breastfeeding and need help.   You have a hard, red, sore area on your breast that is accompanied by a fever.   Your baby is too sleepy to eat well or is having trouble sleeping.   Your baby is wetting less than 6 diapers a day, by 5 days of age.   Your baby's skin or white part of his or her eyes is more yellow than it was in the hospital.   You feel depressed.  Document Released: 03/27/2005 Document Revised: 09/26/2011 Document Reviewed: 06/25/2011 [employer] Patient Information 2013 [employer].  

## 2012-05-06 NOTE — Progress Notes (Signed)
Will schedule RCS Rhogam today

## 2012-05-14 ENCOUNTER — Ambulatory Visit (INDEPENDENT_AMBULATORY_CARE_PROVIDER_SITE_OTHER): Payer: BC Managed Care – PPO | Admitting: Obstetrics & Gynecology

## 2012-05-14 ENCOUNTER — Encounter: Payer: Self-pay | Admitting: Obstetrics & Gynecology

## 2012-05-14 VITALS — BP 118/73 | Wt 283.0 lb

## 2012-05-14 DIAGNOSIS — E669 Obesity, unspecified: Secondary | ICD-10-CM

## 2012-05-14 DIAGNOSIS — Z3480 Encounter for supervision of other normal pregnancy, unspecified trimester: Secondary | ICD-10-CM

## 2012-05-14 DIAGNOSIS — O34219 Maternal care for unspecified type scar from previous cesarean delivery: Secondary | ICD-10-CM

## 2012-05-14 DIAGNOSIS — O99210 Obesity complicating pregnancy, unspecified trimester: Secondary | ICD-10-CM

## 2012-05-14 NOTE — Progress Notes (Signed)
Routine visit. Good FM. No OB problems. I explained kick counts.

## 2012-05-28 ENCOUNTER — Ambulatory Visit (INDEPENDENT_AMBULATORY_CARE_PROVIDER_SITE_OTHER): Payer: BC Managed Care – PPO | Admitting: Family Medicine

## 2012-05-28 VITALS — BP 109/79 | Wt 284.0 lb

## 2012-05-28 DIAGNOSIS — O34219 Maternal care for unspecified type scar from previous cesarean delivery: Secondary | ICD-10-CM

## 2012-05-28 DIAGNOSIS — Z3480 Encounter for supervision of other normal pregnancy, unspecified trimester: Secondary | ICD-10-CM

## 2012-05-28 NOTE — Patient Instructions (Signed)
Pregnancy - Third Trimester The third trimester of pregnancy (the last 3 months) is a period of the most rapid growth for you and your baby. The baby approaches a length of 20 inches and a weight of 6 to 10 pounds. The baby is adding on fat and getting ready for life outside your body. While inside, babies have periods of sleeping and waking, suck their thumbs, and hiccups. You can often feel small contractions of the uterus. This is false labor. It is also called Braxton-Hicks contractions. This is like a practice for labor. The usual problems in this stage of pregnancy include more difficulty breathing, swelling of the hands and feet from water retention, and having to urinate more often because of the uterus and baby pressing on your bladder.  PRENATAL EXAMS  Blood work may continue to be done during prenatal exams. These tests are done to check on your health and the probable health of your baby. Blood work is used to follow your blood levels (hemoglobin). Anemia (low hemoglobin) is common during pregnancy. Iron and vitamins are given to help prevent this. You may also continue to be checked for diabetes. Some of the past blood tests may be done again.  The size of the uterus is measured during each visit. This makes sure your baby is growing properly according to your pregnancy dates.  Your blood pressure is checked every prenatal visit. This is to make sure you are not getting toxemia.  Your urine is checked every prenatal visit for infection, diabetes and protein.  Your weight is checked at each visit. This is done to make sure gains are happening at the suggested rate and that you and your baby are growing normally.  Sometimes, an ultrasound is performed to confirm the position and the proper growth and development of the baby. This is a test done that bounces harmless sound waves off the baby so your caregiver can more accurately determine due dates.  Discuss the type of pain medication  and anesthesia you will have during your labor and delivery.  Discuss the possibility and anesthesia if a Cesarean Section might be necessary.  Inform your caregiver if there is any mental or physical violence at home. Sometimes, a specialized non-stress test, contraction stress test and biophysical profile are done to make sure the baby is not having a problem. Checking the amniotic fluid surrounding the baby is called an amniocentesis. The amniotic fluid is removed by sticking a needle into the belly (abdomen). This is sometimes done near the end of pregnancy if an early delivery is required. In this case, it is done to help make sure the baby's lungs are mature enough for the baby to live outside of the womb. If the lungs are not mature and it is unsafe to deliver the baby, an injection of cortisone medication is given to the mother 1 to 2 days before the delivery. This helps the baby's lungs mature and makes it safer to deliver the baby. CHANGES OCCURING IN THE THIRD TRIMESTER OF PREGNANCY Your body goes through many changes during pregnancy. They vary from person to person. Talk to your caregiver about changes you notice and are concerned about.  During the last trimester, you have probably had an increase in your appetite. It is normal to have cravings for certain foods. This varies from person to person and pregnancy to pregnancy.  You may begin to get stretch marks on your hips, abdomen, and breasts. These are normal changes in the body   during pregnancy. There are no exercises or medications to take which prevent this change.  Constipation may be treated with a stool softener or adding bulk to your diet. Drinking lots of fluids, fiber in vegetables, fruits, and whole grains are helpful.  Exercising is also helpful. If you have been very active up until your pregnancy, most of these activities can be continued during your pregnancy. If you have been less active, it is helpful to start an  exercise program such as walking. Consult your caregiver before starting exercise programs.  Avoid all smoking, alcohol, un-prescribed drugs, herbs and "street drugs" during your pregnancy. These chemicals affect the formation and growth of the baby. Avoid chemicals throughout the pregnancy to ensure the delivery of a healthy infant.  Backache, varicose veins and hemorrhoids may develop or get worse.  You will tire more easily in the third trimester, which is normal.  The baby's movements may be stronger and more often.  You may become short of breath easily.  Your belly button may stick out.  A yellow discharge may leak from your breasts called colostrum.  You may have a bloody mucus discharge. This usually occurs a few days to a week before labor begins. HOME CARE INSTRUCTIONS   Keep your caregiver's appointments. Follow your caregiver's instructions regarding medication use, exercise, and diet.  During pregnancy, you are providing food for you and your baby. Continue to eat regular, well-balanced meals. Choose foods such as meat, fish, milk and other low fat dairy products, vegetables, fruits, and whole-grain breads and cereals. Your caregiver will tell you of the ideal weight gain.  A physical sexual relationship may be continued throughout pregnancy if there are no other problems such as early (premature) leaking of amniotic fluid from the membranes, vaginal bleeding, or belly (abdominal) pain.  Exercise regularly if there are no restrictions. Check with your caregiver if you are unsure of the safety of your exercises. Greater weight gain will occur in the last 2 trimesters of pregnancy. Exercising helps:  Control your weight.  Get you in shape for labor and delivery.  You lose weight after you deliver.  Rest a lot with legs elevated, or as needed for leg cramps or low back pain.  Wear a good support or jogging bra for breast tenderness during pregnancy. This may help if worn  during sleep. Pads or tissues may be used in the bra if you are leaking colostrum.  Do not use hot tubs, steam rooms, or saunas.  Wear your seat belt when driving. This protects you and your baby if you are in an accident.  Avoid raw meat, cat litter boxes and soil used by cats. These carry germs that can cause birth defects in the baby.  It is easier to loose urine during pregnancy. Tightening up and strengthening the pelvic muscles will help with this problem. You can practice stopping your urination while you are going to the bathroom. These are the same muscles you need to strengthen. It is also the muscles you would use if you were trying to stop from passing gas. You can practice tightening these muscles up 10 times a set and repeating this about 3 times per day. Once you know what muscles to tighten up, do not perform these exercises during urination. It is more likely to cause an infection by backing up the urine.  Ask for help if you have financial, counseling or nutritional needs during pregnancy. Your caregiver will be able to offer counseling for these   needs as well as refer you for other special needs.  Make a list of emergency phone numbers and have them available.  Plan on getting help from family or friends when you go home from the hospital.  Make a trial run to the hospital.  Take prenatal classes with the father to understand, practice and ask questions about the labor and delivery.  Prepare the baby's room/nursery.  Do not travel out of the city unless it is absolutely necessary and with the advice of your caregiver.  Wear only low or no heal shoes to have better balance and prevent falling. MEDICATIONS AND DRUG USE IN PREGNANCY  Take prenatal vitamins as directed. The vitamin should contain 1 milligram of folic acid. Keep all vitamins out of reach of children. Only a couple vitamins or tablets containing iron may be fatal to a baby or young child when  ingested.  Avoid use of all medications, including herbs, over-the-counter medications, not prescribed or suggested by your caregiver. Only take over-the-counter or prescription medicines for pain, discomfort, or fever as directed by your caregiver. Do not use aspirin, ibuprofen (Motrin, Advil, Nuprin) or naproxen (Aleve) unless OK'd by your caregiver.  Let your caregiver also know about herbs you may be using.  Alcohol is related to a number of birth defects. This includes fetal alcohol syndrome. All alcohol, in any form, should be avoided completely. Smoking will cause low birth rate and premature babies.  Street/illegal drugs are very harmful to the baby. They are absolutely forbidden. A baby born to an addicted mother will be addicted at birth. The baby will go through the same withdrawal an adult does. SEEK MEDICAL CARE IF: You have any concerns or worries during your pregnancy. It is better to call with your questions if you feel they cannot wait, rather than worry about them. DECISIONS ABOUT CIRCUMCISION You may or may not know the sex of your baby. If you know your baby is a boy, it may be time to think about circumcision. Circumcision is the removal of the foreskin of the penis. This is the skin that covers the sensitive end of the penis. There is no proven medical need for this. Often this decision is made on what is popular at the time or based upon religious beliefs and social issues. You can discuss these issues with your caregiver or pediatrician. SEEK IMMEDIATE MEDICAL CARE IF:   An unexplained oral temperature above 102 F (38.9 C) develops, or as your caregiver suggests.  You have leaking of fluid from the vagina (birth canal). If leaking membranes are suspected, take your temperature and tell your caregiver of this when you call.  There is vaginal spotting, bleeding or passing clots. Tell your caregiver of the amount and how many pads are used.  You develop a bad smelling  vaginal discharge with a change in the color from clear to white.  You develop vomiting that lasts more than 24 hours.  You develop chills or fever.  You develop shortness of breath.  You develop burning on urination.  You loose more than 2 pounds of weight or gain more than 2 pounds of weight or as suggested by your caregiver.  You notice sudden swelling of your face, hands, and feet or legs.  You develop belly (abdominal) pain. Round ligament discomfort is a common non-cancerous (benign) cause of abdominal pain in pregnancy. Your caregiver still must evaluate you.  You develop a severe headache that does not go away.  You develop visual   problems, blurred or double vision.  If you have not felt your baby move for more than 1 hour. If you think the baby is not moving as much as usual, eat something with sugar in it and lie down on your left side for an hour. The baby should move at least 4 to 5 times per hour. Call right away if your baby moves less than that.  You fall, are in a car accident or any kind of trauma.  There is mental or physical violence at home. Document Released: 03/21/2001 Document Revised: 06/19/2011 Document Reviewed: 09/23/2008 [employer] Patient Information 2013 [employer].  Breastfeeding Deciding to breastfeed is one of the best choices you can make for you and your baby. The information that follows gives a brief overview of the benefits of breastfeeding as well as common topics surrounding breastfeeding. BENEFITS OF BREASTFEEDING For the baby  The first milk (colostrum) helps the baby's digestive system function better.   There are antibodies in the mother's milk that help the baby fight off infections.   The baby has a lower incidence of asthma, allergies, and sudden infant death syndrome (SIDS).   The nutrients in breast milk are better for the baby than infant formulas, and breast milk helps the baby's brain grow better.   Babies who  breastfeed have less gas, colic, and constipation.  For the mother  Breastfeeding helps develop a very special bond between the mother and her baby.   Breastfeeding is convenient, always available at the correct temperature, and costs nothing.   Breastfeeding burns calories in the mother and helps her lose weight that was gained during pregnancy.   Breastfeeding makes the uterus contract back down to normal size faster and slows bleeding following delivery.   Breastfeeding mothers have a lower risk of developing breast cancer.  BREASTFEEDING FREQUENCY  A healthy, full-term baby may breastfeed as often as every hour or space his or her feedings to every 3 hours.   Watch your baby for signs of hunger. Nurse your baby if he or she shows signs of hunger. How often you nurse will vary from baby to baby.   Nurse as often as the baby requests, or when you feel the need to reduce the fullness of your breasts.   Awaken the baby if it has been 3 4 hours since the last feeding.   Frequent feeding will help the mother make more milk and will help prevent problems, such as sore nipples and engorgement of the breasts.  BABY'S POSITION AT THE BREAST  Whether lying down or sitting, be sure that the baby's tummy is facing your tummy.   Support the breast with 4 fingers underneath the breast and the thumb above. Make sure your fingers are well away from the nipple and baby's mouth.   Stroke the baby's lips gently with your finger or nipple.   When the baby's mouth is open wide enough, place all of your nipple and as much of the areola as possible into your baby's mouth.   Pull the baby in close so the tip of the nose and the baby's cheeks touch the breast during the feeding.  FEEDINGS AND SUCTION  The length of each feeding varies from baby to baby and from feeding to feeding.   The baby must suck about 2 3 minutes for your milk to get to him or her. This is called a "let down."  For this reason, allow the baby to feed on each breast as   long as he or she wants. Your baby will end the feeding when he or she has received the right balance of nutrients.   To break the suction, put your finger into the corner of the baby's mouth and slide it between his or her gums before removing your breast from his or her mouth. This will help prevent sore nipples.  HOW TO TELL WHETHER YOUR BABY IS GETTING ENOUGH BREAST MILK. Wondering whether or not your baby is getting enough milk is a common concern among mothers. You can be assured that your baby is getting enough milk if:   Your baby is actively sucking and you hear swallowing.   Your baby seems relaxed and satisfied after a feeding.   Your baby nurses at least 8 12 times in a 24 hour time period. Nurse your baby until he or she unlatches or falls asleep at the first breast (at least 10 20 minutes), then offer the second side.   Your baby is wetting 5 6 disposable diapers (6 8 cloth diapers) in a 24 hour period by 5 6 days of age.   Your baby is having at least 3 4 stools every 24 hours for the first 6 weeks. The stool should be soft and yellow.   Your baby should gain 4 7 ounces per week after he or she is 4 days old.   Your breasts feel softer after nursing.  REDUCING BREAST ENGORGEMENT  In the first week after your baby is born, you may experience signs of breast engorgement. When breasts are engorged, they feel heavy, warm, full, and may be tender to the touch. You can reduce engorgement if you:   Nurse frequently, every 2 3 hours. Mothers who breastfeed early and often have fewer problems with engorgement.   Place light ice packs on your breasts for 10 20 minutes between feedings. This reduces swelling. Wrap the ice packs in a lightweight towel to protect your skin. Bags of frozen vegetables work well for this purpose.   Take a warm shower or apply warm, moist heat to your breast for 5 10 minutes just before  each feeding. This increases circulation and helps the milk flow.   Gently massage your breast before and during the feeding. Using your finger tips, massage from the chest wall towards your nipple in a circular motion.   Make sure that the baby empties at least one breast at every feeding before switching sides.   Use a breast pump to empty the breasts if your baby is sleepy or not nursing well. You may also want to pump if you are returning to work oryou feel you are getting engorged.   Avoid bottle feeds, pacifiers, or supplemental feedings of water or juice in place of breastfeeding. Breast milk is all the food your baby needs. It is not necessary for your baby to have water or formula. In fact, to help your breasts make more milk, it is best not to give your baby supplemental feedings during the early weeks.   Be sure the baby is latched on and positioned properly while breastfeeding.   Wear a supportive bra, avoiding underwire styles.   Eat a balanced diet with enough fluids.   Rest often, relax, and take your prenatal vitamins to prevent fatigue, stress, and anemia.  If you follow these suggestions, your engorgement should improve in 24 48 hours. If you are still experiencing difficulty, call your lactation consultant or caregiver.  CARING FOR YOURSELF Take care of your   breasts  Bathe or shower daily.   Avoid using soap on your nipples.   Start feedings on your left breast at one feeding and on your right breast at the next feeding.   You will notice an increase in your milk supply 2 5 days after delivery. You may feel some discomfort from engorgement, which makes your breasts very firm and often tender. Engorgement "peaks" out within 24 48 hours. In the meantime, apply warm moist towels to your breasts for 5 10 minutes before feeding. Gentle massage and expression of some milk before feeding will soften your breasts, making it easier for your baby to latch on.    Wear a well-fitting nursing bra, and air dry your nipples for a 3 4minutes after each feeding.   Only use cotton bra pads.   Only use pure lanolin on your nipples after nursing. You do not need to wash it off before feeding the baby again. Another option is to express a few drops of breast milk and gently massage it into your nipples.  Take care of yourself  Eat well-balanced meals and nutritious snacks.   Drinking milk, fruit juice, and water to satisfy your thirst (about 8 glasses a day).   Get plenty of rest.  Avoid foods that you notice affect the baby in a bad way.  SEEK MEDICAL CARE IF:   You have difficulty with breastfeeding and need help.   You have a hard, red, sore area on your breast that is accompanied by a fever.   Your baby is too sleepy to eat well or is having trouble sleeping.   Your baby is wetting less than 6 diapers a day, by 5 days of age.   Your baby's skin or white part of his or her eyes is more yellow than it was in the hospital.   You feel depressed.  Document Released: 03/27/2005 Document Revised: 09/26/2011 Document Reviewed: 06/25/2011 [employer] Patient Information 2013 [employer].  

## 2012-05-28 NOTE — Progress Notes (Signed)
Reports some pelvic pressure--though head is now down. Some SOB especially with exertion.

## 2012-05-29 ENCOUNTER — Encounter: Payer: Self-pay | Admitting: Family Medicine

## 2012-06-11 ENCOUNTER — Encounter: Payer: BC Managed Care – PPO | Admitting: Obstetrics & Gynecology

## 2012-06-24 ENCOUNTER — Ambulatory Visit (INDEPENDENT_AMBULATORY_CARE_PROVIDER_SITE_OTHER): Payer: BC Managed Care – PPO | Admitting: Obstetrics & Gynecology

## 2012-06-24 ENCOUNTER — Encounter: Payer: Self-pay | Admitting: Obstetrics & Gynecology

## 2012-06-24 VITALS — BP 129/68 | Wt 287.0 lb

## 2012-06-24 DIAGNOSIS — Z3480 Encounter for supervision of other normal pregnancy, unspecified trimester: Secondary | ICD-10-CM

## 2012-06-24 DIAGNOSIS — Z3483 Encounter for supervision of other normal pregnancy, third trimester: Secondary | ICD-10-CM

## 2012-06-24 DIAGNOSIS — O34219 Maternal care for unspecified type scar from previous cesarean delivery: Secondary | ICD-10-CM

## 2012-06-24 NOTE — Progress Notes (Signed)
Routine OB visit. Good FM. No OB problems. Labor precautions reviewed. Cervical cultures done today.

## 2012-06-25 LAB — GC/CHLAMYDIA PROBE AMP, GENITAL: Chlamydia, DNA Probe: NEGATIVE

## 2012-06-27 LAB — CULTURE, BETA STREP (GROUP B ONLY)

## 2012-07-01 ENCOUNTER — Encounter: Payer: Self-pay | Admitting: Obstetrics & Gynecology

## 2012-07-02 ENCOUNTER — Ambulatory Visit (INDEPENDENT_AMBULATORY_CARE_PROVIDER_SITE_OTHER): Payer: BC Managed Care – PPO | Admitting: Obstetrics & Gynecology

## 2012-07-02 VITALS — BP 110/91 | Wt 290.0 lb

## 2012-07-02 DIAGNOSIS — Z3480 Encounter for supervision of other normal pregnancy, unspecified trimester: Secondary | ICD-10-CM

## 2012-07-02 DIAGNOSIS — O34219 Maternal care for unspecified type scar from previous cesarean delivery: Secondary | ICD-10-CM

## 2012-07-02 DIAGNOSIS — Z3483 Encounter for supervision of other normal pregnancy, third trimester: Secondary | ICD-10-CM

## 2012-07-02 NOTE — Progress Notes (Signed)
RLTCS and BTL scheduled for 07/16/12.  No other complaints or concerns.  Fetal movement and labor precautions reviewed.

## 2012-07-02 NOTE — Patient Instructions (Signed)
Return to clinic for any obstetric concerns or go to [facility] for evaluation  

## 2012-07-08 ENCOUNTER — Ambulatory Visit (INDEPENDENT_AMBULATORY_CARE_PROVIDER_SITE_OTHER): Payer: BC Managed Care – PPO | Admitting: Obstetrics & Gynecology

## 2012-07-08 VITALS — BP 114/82 | Wt 292.0 lb

## 2012-07-08 DIAGNOSIS — O368131 Decreased fetal movements, third trimester, fetus 1: Secondary | ICD-10-CM

## 2012-07-08 DIAGNOSIS — O34219 Maternal care for unspecified type scar from previous cesarean delivery: Secondary | ICD-10-CM

## 2012-07-08 DIAGNOSIS — Z3480 Encounter for supervision of other normal pregnancy, unspecified trimester: Secondary | ICD-10-CM

## 2012-07-08 DIAGNOSIS — Z3483 Encounter for supervision of other normal pregnancy, third trimester: Secondary | ICD-10-CM

## 2012-07-08 DIAGNOSIS — O368190 Decreased fetal movements, unspecified trimester, not applicable or unspecified: Secondary | ICD-10-CM

## 2012-07-08 NOTE — Patient Instructions (Signed)
Return to clinic for any obstetric concerns or go to [facility] for evaluation  

## 2012-07-08 NOTE — Progress Notes (Signed)
Decreased FM, NST reactive.  No other complaints or concerns.  Fetal movement and labor precautions reviewed.  Scheduled for RLTCS and BTL on 07/16/12.

## 2012-07-09 ENCOUNTER — Inpatient Hospital Stay (HOSPITAL_COMMUNITY): Admission: RE | Admit: 2012-07-09 | Payer: BC Managed Care – PPO | Source: Ambulatory Visit

## 2012-07-12 ENCOUNTER — Other Ambulatory Visit (HOSPITAL_COMMUNITY): Payer: BC Managed Care – PPO

## 2012-07-13 ENCOUNTER — Encounter (HOSPITAL_COMMUNITY): Payer: Self-pay | Admitting: Pharmacy Technician

## 2012-07-15 ENCOUNTER — Encounter (HOSPITAL_COMMUNITY): Payer: Self-pay

## 2012-07-15 ENCOUNTER — Ambulatory Visit (INDEPENDENT_AMBULATORY_CARE_PROVIDER_SITE_OTHER): Payer: BC Managed Care – PPO | Admitting: Obstetrics and Gynecology

## 2012-07-15 ENCOUNTER — Encounter (HOSPITAL_COMMUNITY)
Admission: RE | Admit: 2012-07-15 | Discharge: 2012-07-15 | Disposition: A | Discharge status: Home / Self Care | Payer: BC Managed Care – PPO | Source: Ambulatory Visit | Attending: Obstetrics and Gynecology | Admitting: Obstetrics and Gynecology

## 2012-07-15 VITALS — BP 108/72 | Wt 293.0 lb

## 2012-07-15 VITALS — HR 98 | Temp 97.2°F | Resp 20 | Ht 65.0 in | Wt 290.0 lb

## 2012-07-15 DIAGNOSIS — O99210 Obesity complicating pregnancy, unspecified trimester: Secondary | ICD-10-CM

## 2012-07-15 DIAGNOSIS — Z3480 Encounter for supervision of other normal pregnancy, unspecified trimester: Secondary | ICD-10-CM

## 2012-07-15 DIAGNOSIS — O360131 Maternal care for anti-D [Rh] antibodies, third trimester, fetus 1: Secondary | ICD-10-CM

## 2012-07-15 DIAGNOSIS — E669 Obesity, unspecified: Secondary | ICD-10-CM

## 2012-07-15 DIAGNOSIS — Z3483 Encounter for supervision of other normal pregnancy, third trimester: Secondary | ICD-10-CM

## 2012-07-15 DIAGNOSIS — O34219 Maternal care for unspecified type scar from previous cesarean delivery: Secondary | ICD-10-CM

## 2012-07-15 DIAGNOSIS — O360990 Maternal care for other rhesus isoimmunization, unspecified trimester, not applicable or unspecified: Secondary | ICD-10-CM

## 2012-07-15 HISTORY — DX: Gastro-esophageal reflux disease without esophagitis: K21.9

## 2012-07-15 LAB — CBC
MCH: 28.8 pg (ref 26.0–34.0)
MCHC: 32.8 g/dL (ref 30.0–36.0)
Platelets: 260 10*3/uL (ref 150–400)

## 2012-07-15 LAB — TYPE AND SCREEN: ABO/RH(D): O NEG

## 2012-07-15 LAB — ABO/RH: ABO/RH(D): O NEG

## 2012-07-15 NOTE — Progress Notes (Signed)
Patient doing well without complaints. C-section questions answered. FM/labor precautions reviewed. Patient scheduled for RLTC with BTL tomorrow

## 2012-07-15 NOTE — Patient Instructions (Addendum)
[name]  07/15/2012   Your procedure is scheduled on:  07/16/12  Enter through the Main Entrance of [facility] at 8 AM.  Pick up the phone at the desk and dial [phone].   Call this number if you have problems the morning of surgery: [phone]   Remember:   Do not eat food:After Midnight.  Do not drink clear liquids: After Midnight.  Take these medicines the morning of surgery with A SIP OF WATER: Prilosec   Do not wear jewelry, make-up or nail polish.  Do not wear lotions, powders, or perfumes. You may wear deodorant.  Do not shave 48 hours prior to surgery.  Do not bring valuables to the hospital.  Contacts, dentures or bridgework may not be worn into surgery.  Leave suitcase in the car. After surgery it may be brought to your room.  For patients admitted to the hospital, checkout time is 11:00 AM the day of discharge.   Patients discharged the day of surgery will not be allowed to drive home.  Name and phone number of your driver: NA  Special Instructions: Shower using CHG 2 nights before surgery and the night before surgery.  If you shower the day of surgery use CHG.  Use special wash - you have one bottle of CHG for all showers.  You should use approximately 1/3 of the bottle for each shower.   Please read over the following fact sheets that you were given: Surgical Site Infection Prevention

## 2012-07-16 ENCOUNTER — Inpatient Hospital Stay (HOSPITAL_COMMUNITY): Payer: BC Managed Care – PPO

## 2012-07-16 ENCOUNTER — Inpatient Hospital Stay (HOSPITAL_COMMUNITY)
Admission: RE | Admit: 2012-07-16 | Discharge: 2012-07-18 | DRG: 371 | Disposition: A | Discharge status: Home / Self Care | Payer: BC Managed Care – PPO | Source: Ambulatory Visit | Attending: Obstetrics and Gynecology | Admitting: Obstetrics and Gynecology

## 2012-07-16 ENCOUNTER — Encounter (HOSPITAL_COMMUNITY): Payer: Self-pay

## 2012-07-16 ENCOUNTER — Encounter (HOSPITAL_COMMUNITY): Payer: Self-pay | Admitting: Anesthesiology

## 2012-07-16 ENCOUNTER — Encounter (HOSPITAL_COMMUNITY)
Admission: RE | Disposition: A | Discharge status: Home / Self Care | Payer: Self-pay | Source: Ambulatory Visit | Attending: Obstetrics and Gynecology

## 2012-07-16 DIAGNOSIS — O99214 Obesity complicating childbirth: Secondary | ICD-10-CM | POA: Diagnosis present

## 2012-07-16 DIAGNOSIS — Z302 Encounter for sterilization: Secondary | ICD-10-CM

## 2012-07-16 DIAGNOSIS — K219 Gastro-esophageal reflux disease without esophagitis: Secondary | ICD-10-CM | POA: Diagnosis present

## 2012-07-16 DIAGNOSIS — O360990 Maternal care for other rhesus isoimmunization, unspecified trimester, not applicable or unspecified: Secondary | ICD-10-CM

## 2012-07-16 DIAGNOSIS — O99210 Obesity complicating pregnancy, unspecified trimester: Secondary | ICD-10-CM

## 2012-07-16 DIAGNOSIS — O34219 Maternal care for unspecified type scar from previous cesarean delivery: Principal | ICD-10-CM | POA: Diagnosis present

## 2012-07-16 DIAGNOSIS — O360191 Maternal care for anti-D [Rh] antibodies, unspecified trimester, fetus 1: Secondary | ICD-10-CM

## 2012-07-16 DIAGNOSIS — E669 Obesity, unspecified: Secondary | ICD-10-CM | POA: Diagnosis present

## 2012-07-16 HISTORY — PX: TUBAL LIGATION: SHX77

## 2012-07-16 SURGERY — LIGATION, FALLOPIAN TUBE, BILATERAL
Anesthesia: Spinal | Site: Abdomen | Wound class: Clean Contaminated

## 2012-07-16 MED ORDER — SCOPOLAMINE 1 MG/3DAYS TD PT72
MEDICATED_PATCH | TRANSDERMAL | Status: AC
  Administered 2012-07-16: 1.5 mg via TRANSDERMAL
  Filled 2012-07-16: qty 1

## 2012-07-16 MED ORDER — BUPIVACAINE IN DEXTROSE 0.75-8.25 % IT SOLN
INTRATHECAL | Status: DC | PRN
  Administered 2012-07-16: 1.6 mL via INTRATHECAL

## 2012-07-16 MED ORDER — MORPHINE SULFATE 0.5 MG/ML IJ SOLN
INTRAMUSCULAR | Status: AC
  Filled 2012-07-16: qty 10

## 2012-07-16 MED ORDER — PRENATAL MULTIVITAMIN CH
1.0000 | ORAL_TABLET | Freq: Every day | ORAL | Status: DC
  Administered 2012-07-17: 1 via ORAL
  Filled 2012-07-16: qty 1

## 2012-07-16 MED ORDER — PROMETHAZINE HCL 25 MG/ML IJ SOLN: 6.2500 mg | INTRAMUSCULAR | Status: DC | PRN

## 2012-07-16 MED ORDER — SIMETHICONE 80 MG PO CHEW
80.0000 mg | CHEWABLE_TABLET | Freq: Three times a day (TID) | ORAL | Status: DC
  Administered 2012-07-17 (×4): 80 mg via ORAL

## 2012-07-16 MED ORDER — KETOROLAC TROMETHAMINE 30 MG/ML IJ SOLN: 15.0000 mg | Freq: Once | INTRAMUSCULAR | Status: DC | PRN

## 2012-07-16 MED ORDER — LANOLIN HYDROUS EX OINT: 1.0000 "application " | TOPICAL_OINTMENT | CUTANEOUS | Status: DC | PRN

## 2012-07-16 MED ORDER — PHENYLEPHRINE 40 MCG/ML (10ML) SYRINGE FOR IV PUSH (FOR BLOOD PRESSURE SUPPORT)
PREFILLED_SYRINGE | INTRAVENOUS | Status: AC
  Filled 2012-07-16: qty 10

## 2012-07-16 MED ORDER — NALBUPHINE HCL 10 MG/ML IJ SOLN
5.0000 mg | INTRAMUSCULAR | Status: DC | PRN
  Filled 2012-07-16: qty 1

## 2012-07-16 MED ORDER — MEPERIDINE HCL 25 MG/ML IJ SOLN: 6.2500 mg | INTRAMUSCULAR | Status: DC | PRN

## 2012-07-16 MED ORDER — KETOROLAC TROMETHAMINE 30 MG/ML IJ SOLN: 30.0000 mg | Freq: Four times a day (QID) | INTRAMUSCULAR | Status: AC | PRN

## 2012-07-16 MED ORDER — ONDANSETRON HCL 4 MG/2ML IJ SOLN: 4.0000 mg | INTRAMUSCULAR | Status: DC | PRN

## 2012-07-16 MED ORDER — ONDANSETRON HCL 4 MG/2ML IJ SOLN
INTRAMUSCULAR | Status: DC | PRN
  Administered 2012-07-16: 4 mg via INTRAVENOUS

## 2012-07-16 MED ORDER — OXYTOCIN 40 UNITS IN LACTATED RINGERS INFUSION - SIMPLE MED: 62.5000 mL/h | INTRAVENOUS | Status: AC

## 2012-07-16 MED ORDER — LACTATED RINGERS IV SOLN: INTRAVENOUS | Status: DC

## 2012-07-16 MED ORDER — WITCH HAZEL-GLYCERIN EX PADS: 1.0000 "application " | MEDICATED_PAD | CUTANEOUS | Status: DC | PRN

## 2012-07-16 MED ORDER — FENTANYL CITRATE 0.05 MG/ML IJ SOLN
INTRAMUSCULAR | Status: AC
  Filled 2012-07-16: qty 2

## 2012-07-16 MED ORDER — DEXTROSE 5 % IV SOLN
3.0000 g | INTRAVENOUS | Status: AC
  Administered 2012-07-16: 3 g via INTRAVENOUS

## 2012-07-16 MED ORDER — MORPHINE SULFATE (PF) 0.5 MG/ML IJ SOLN
INTRAMUSCULAR | Status: DC | PRN
  Administered 2012-07-16: .2 mg via INTRATHECAL

## 2012-07-16 MED ORDER — EPHEDRINE SULFATE 50 MG/ML IJ SOLN
INTRAMUSCULAR | Status: DC | PRN
  Administered 2012-07-16: 5 mg via INTRAVENOUS
  Administered 2012-07-16: 10 mg via INTRAVENOUS
  Administered 2012-07-16 (×2): 5 mg via INTRAVENOUS
  Administered 2012-07-16: 10 mg via INTRAVENOUS
  Administered 2012-07-16: 5 mg via INTRAVENOUS

## 2012-07-16 MED ORDER — NALOXONE HCL 1 MG/ML IJ SOLN
1.0000 ug/kg/h | INTRAVENOUS | Status: DC | PRN
  Filled 2012-07-16: qty 2

## 2012-07-16 MED ORDER — SIMETHICONE 80 MG PO CHEW
80.0000 mg | CHEWABLE_TABLET | ORAL | Status: DC | PRN
  Administered 2012-07-16: 80 mg via ORAL

## 2012-07-16 MED ORDER — KETOROLAC TROMETHAMINE 60 MG/2ML IM SOLN
INTRAMUSCULAR | Status: AC
  Administered 2012-07-16: 60 mg via INTRAMUSCULAR
  Filled 2012-07-16: qty 2

## 2012-07-16 MED ORDER — DIPHENHYDRAMINE HCL 50 MG/ML IJ SOLN: 12.5000 mg | INTRAMUSCULAR | Status: DC | PRN

## 2012-07-16 MED ORDER — ONDANSETRON HCL 4 MG/2ML IJ SOLN
INTRAMUSCULAR | Status: AC
  Filled 2012-07-16: qty 2

## 2012-07-16 MED ORDER — OXYTOCIN 10 UNIT/ML IJ SOLN
INTRAMUSCULAR | Status: AC
  Filled 2012-07-16: qty 4

## 2012-07-16 MED ORDER — ONDANSETRON HCL 4 MG PO TABS: 4.0000 mg | ORAL_TABLET | ORAL | Status: DC | PRN

## 2012-07-16 MED ORDER — OXYCODONE-ACETAMINOPHEN 5-325 MG PO TABS: 1.0000 | ORAL_TABLET | ORAL | Status: DC | PRN

## 2012-07-16 MED ORDER — DIPHENHYDRAMINE HCL 25 MG PO CAPS: 25.0000 mg | ORAL_CAPSULE | ORAL | Status: DC | PRN

## 2012-07-16 MED ORDER — PHENYLEPHRINE HCL 10 MG/ML IJ SOLN
INTRAMUSCULAR | Status: DC | PRN
  Administered 2012-07-16: 80 ug via INTRAVENOUS
  Administered 2012-07-16 (×2): 40 ug via INTRAVENOUS
  Administered 2012-07-16: 80 ug via INTRAVENOUS
  Administered 2012-07-16 (×2): 40 ug via INTRAVENOUS
  Administered 2012-07-16: 80 ug via INTRAVENOUS

## 2012-07-16 MED ORDER — DIBUCAINE 1 % RE OINT: 1.0000 "application " | TOPICAL_OINTMENT | RECTAL | Status: DC | PRN

## 2012-07-16 MED ORDER — METOCLOPRAMIDE HCL 5 MG/ML IJ SOLN: 10.0000 mg | Freq: Three times a day (TID) | INTRAMUSCULAR | Status: DC | PRN

## 2012-07-16 MED ORDER — NALOXONE HCL 0.4 MG/ML IJ SOLN: 0.4000 mg | INTRAMUSCULAR | Status: DC | PRN

## 2012-07-16 MED ORDER — SCOPOLAMINE 1 MG/3DAYS TD PT72: 1.0000 | MEDICATED_PATCH | Freq: Once | TRANSDERMAL | Status: DC

## 2012-07-16 MED ORDER — TETANUS-DIPHTH-ACELL PERTUSSIS 5-2.5-18.5 LF-MCG/0.5 IM SUSP: 0.5000 mL | Freq: Once | INTRAMUSCULAR | Status: DC

## 2012-07-16 MED ORDER — DIPHENHYDRAMINE HCL 25 MG PO CAPS: 25.0000 mg | ORAL_CAPSULE | Freq: Four times a day (QID) | ORAL | Status: DC | PRN

## 2012-07-16 MED ORDER — MENTHOL 3 MG MT LOZG: 1.0000 | LOZENGE | OROMUCOSAL | Status: DC | PRN

## 2012-07-16 MED ORDER — LACTATED RINGERS IV SOLN
INTRAVENOUS | Status: DC
  Administered 2012-07-16 (×3): via INTRAVENOUS

## 2012-07-16 MED ORDER — KETOROLAC TROMETHAMINE 60 MG/2ML IM SOLN: 60.0000 mg | Freq: Once | INTRAMUSCULAR | Status: AC | PRN

## 2012-07-16 MED ORDER — IBUPROFEN 600 MG PO TABS
600.0000 mg | ORAL_TABLET | Freq: Four times a day (QID) | ORAL | Status: DC
  Administered 2012-07-16 – 2012-07-18 (×7): 600 mg via ORAL
  Filled 2012-07-16 (×8): qty 1

## 2012-07-16 MED ORDER — LACTATED RINGERS IV SOLN
Freq: Once | INTRAVENOUS | Status: AC
  Administered 2012-07-16: 09:00:00 via INTRAVENOUS

## 2012-07-16 MED ORDER — LACTATED RINGERS IV SOLN
INTRAVENOUS | Status: DC | PRN
  Administered 2012-07-16: 10:00:00 via INTRAVENOUS

## 2012-07-16 MED ORDER — SODIUM CHLORIDE 0.9 % IJ SOLN: 3.0000 mL | INTRAMUSCULAR | Status: DC | PRN

## 2012-07-16 MED ORDER — SENNOSIDES-DOCUSATE SODIUM 8.6-50 MG PO TABS
2.0000 | ORAL_TABLET | Freq: Every day | ORAL | Status: DC
  Administered 2012-07-16 – 2012-07-17 (×2): 2 via ORAL

## 2012-07-16 MED ORDER — DIPHENHYDRAMINE HCL 50 MG/ML IJ SOLN: 25.0000 mg | INTRAMUSCULAR | Status: DC | PRN

## 2012-07-16 MED ORDER — EPHEDRINE 5 MG/ML INJ
INTRAVENOUS | Status: AC
  Filled 2012-07-16: qty 10

## 2012-07-16 MED ORDER — OXYTOCIN 10 UNIT/ML IJ SOLN
40.0000 [IU] | INTRAVENOUS | Status: DC | PRN
  Administered 2012-07-16: 40 [IU] via INTRAVENOUS

## 2012-07-16 MED ORDER — ONDANSETRON HCL 4 MG/2ML IJ SOLN: 4.0000 mg | Freq: Three times a day (TID) | INTRAMUSCULAR | Status: DC | PRN

## 2012-07-16 MED ORDER — FENTANYL CITRATE 0.05 MG/ML IJ SOLN
INTRAMUSCULAR | Status: DC | PRN
  Administered 2012-07-16: 12.5 ug via INTRATHECAL

## 2012-07-16 MED ORDER — HYDROMORPHONE HCL PF 1 MG/ML IJ SOLN: 0.2500 mg | INTRAMUSCULAR | Status: DC | PRN

## 2012-07-16 SURGICAL SUPPLY — 30 items
CONTAINER PREFILL 10% NBF 15ML (MISCELLANEOUS) ×6 IMPLANT
DRAPE LG THREE QUARTER DISP (DRAPES) ×3 IMPLANT
DRESSING TELFA 8X3 (GAUZE/BANDAGES/DRESSINGS) ×3 IMPLANT
DRSG OPSITE POSTOP 4X10 (GAUZE/BANDAGES/DRESSINGS) ×3 IMPLANT
DURAPREP 26ML APPLICATOR (WOUND CARE) ×3 IMPLANT
ELECT REM PT RETURN 9FT ADLT (ELECTROSURGICAL) ×3
ELECTRODE REM PT RTRN 9FT ADLT (ELECTROSURGICAL) ×2 IMPLANT
GLOVE BIOGEL PI IND STRL 6.5 (GLOVE) ×2 IMPLANT
GLOVE BIOGEL PI IND STRL 7.0 (GLOVE) ×4 IMPLANT
GLOVE BIOGEL PI INDICATOR 6.5 (GLOVE) ×1
GLOVE BIOGEL PI INDICATOR 7.0 (GLOVE) ×2
GLOVE NEODERM STER SZ 7 (GLOVE) ×9 IMPLANT
GLOVE SURG SS PI 6.0 STRL IVOR (GLOVE) ×3 IMPLANT
GLOVE SURG SS PI 7.0 STRL IVOR (GLOVE) ×6 IMPLANT
GOWN STRL REIN XL XLG (GOWN DISPOSABLE) ×12 IMPLANT
KIT ABG SYR 3ML LUER SLIP (SYRINGE) IMPLANT
NS IRRIG 1000ML POUR BTL (IV SOLUTION) ×9 IMPLANT
PACK C SECTION WH (CUSTOM PROCEDURE TRAY) ×3 IMPLANT
PAD ABD 7.5X8 STRL (GAUZE/BANDAGES/DRESSINGS) ×3 IMPLANT
PAD OB MATERNITY 4.3X12.25 (PERSONAL CARE ITEMS) ×3 IMPLANT
RTRCTR C-SECT PINK 25CM LRG (MISCELLANEOUS) ×3 IMPLANT
SLEEVE SCD COMPRESS KNEE MED (MISCELLANEOUS) ×3 IMPLANT
SPONGE GAUZE 2X2 8PLY STRL LF (GAUZE/BANDAGES/DRESSINGS) ×3 IMPLANT
SUT PLAIN 0 NONE (SUTURE) ×3 IMPLANT
SUT VIC AB 0 CT1 36 (SUTURE) ×12 IMPLANT
SUT VIC AB 4-0 KS 27 (SUTURE) ×3 IMPLANT
TAPE CLOTH SURG 4X10 WHT LF (GAUZE/BANDAGES/DRESSINGS) ×3 IMPLANT
TOWEL OR 17X24 6PK STRL BLUE (TOWEL DISPOSABLE) ×12 IMPLANT
TRAY FOLEY CATH 14FR (SET/KITS/TRAYS/PACK) ×3 IMPLANT
WATER STERILE IRR 1000ML POUR (IV SOLUTION) ×3 IMPLANT

## 2012-07-16 NOTE — Anesthesia Preprocedure Evaluation (Signed)
Anesthesia Evaluation  Patient identified by MRN, date of birth, ID band Patient awake    Reviewed: Allergy & Precautions, H&P , NPO status , Patient's Chart, lab work & pertinent test results  Airway Mallampati: II TM Distance: >3 FB Neck ROM: full    Dental no notable dental hx.    Pulmonary neg pulmonary ROS,    Pulmonary exam normal       Cardiovascular negative cardio ROS      Neuro/Psych negative neurological ROS  negative psych ROS   GI/Hepatic Neg liver ROS,   Endo/Other  Morbid obesity  Renal/GU negative Renal ROS  negative genitourinary   Musculoskeletal negative musculoskeletal ROS (+)   Abdominal (+) + obese,   Peds negative pediatric ROS (+)  Hematology negative hematology ROS (+)   Anesthesia Other Findings   Reproductive/Obstetrics (+) Pregnancy                           Anesthesia Physical Anesthesia Plan  ASA: III  Anesthesia Plan: Spinal   Post-op Pain Management:    Induction:   Airway Management Planned:   Additional Equipment:   Intra-op Plan:   Post-operative Plan:   Informed Consent: I have reviewed the patients History and Physical, chart, labs and discussed the procedure including the risks, benefits and alternatives for the proposed anesthesia with the patient or authorized representative who has indicated his/her understanding and acceptance.     Plan Discussed with: CRNA and Surgeon  Anesthesia Plan Comments:         Anesthesia Quick Evaluation

## 2012-07-16 NOTE — OR Nursing (Signed)
Placenta to OR refrigerator.

## 2012-07-16 NOTE — Anesthesia Postprocedure Evaluation (Signed)
  Anesthesia Post-op Note  Patient: Patricia Compton  Procedure(s) Performed: Procedure(s): BILATERAL TUBAL LIGATION (Bilateral) CESAREAN SECTION (N/A)  Patient Location: Mother/Baby  Anesthesia Type:Spinal  Level of Consciousness: awake  Airway and Oxygen Therapy: Patient Spontanous Breathing  Post-op Pain: mild  Post-op Assessment: Patient's Cardiovascular Status Stable and Respiratory Function Stable  Post-op Vital Signs: stable  Complications: No apparent anesthesia complications

## 2012-07-16 NOTE — Anesthesia Procedure Notes (Signed)
Spinal  Patient location during procedure: OR Start time: 07/16/2012 9:09 AM End time: 07/16/2012 9:12 AM Staffing Anesthesiologist: Sandrea Hughs Performed by: anesthesiologist  Preanesthetic Checklist Completed: patient identified, site marked, surgical consent, pre-op evaluation, timeout performed, IV checked, risks and benefits discussed and monitors and equipment checked Spinal Block Patient position: sitting Prep: DuraPrep Patient monitoring: heart rate, cardiac monitor, continuous pulse ox and blood pressure Approach: midline Location: L3-4 Injection technique: single-shot Needle Needle type: Sprotte  Needle gauge: 24 G Needle length: 9 cm Needle insertion depth: 6 cm Assessment Sensory level: T4

## 2012-07-16 NOTE — Anesthesia Postprocedure Evaluation (Signed)
Anesthesia Post Note  Patient: Patricia Compton  Procedure(s) Performed: Procedure(s) (LRB): BILATERAL TUBAL LIGATION (Bilateral) CESAREAN SECTION (N/A)  Anesthesia type: Spinal  Patient location: PACU  Post pain: Pain level controlled  Post assessment: Post-op Vital signs reviewed  Last Vitals:  Filed Vitals:   07/16/12 1045  BP: 109/51  Pulse: 74  Temp:   Resp: 20    Post vital signs: Reviewed  Level of consciousness: awake  Complications: No apparent anesthesia complications

## 2012-07-16 NOTE — H&P (Signed)
Patricia Compton is a 32 y.o. female G2P1001 at 103w0d presenting for scheduled elective repeat cesarean section and bilateral tubal ligation. Patient had prenatal care at [facility] office since [redacted] weeks GA. Patient prenatal care complicated by obesity, prior cesarean section and Rh negative status s/p rhogam at 28 weeks. Patient doing well and is currently without any complaints.  History OB History   Grav Para Term Preterm Abortions TAB SAB Ect Mult Living   2 1 1       1      Past Medical History  Diagnosis Date  . Morbid obesity   . GERD (gastroesophageal reflux disease)    Past Surgical History  Procedure Laterality Date  . Cesarean section    . Wisdom tooth extraction    . Cholecystectomy     Family History: family history includes COPD in her father; Cancer in her maternal grandmother and mother; and Hypertension in her mother. Social History:  reports that she has never smoked. She does not have any smokeless tobacco history on file. She reports that she does not drink alcohol or use illicit drugs.   Prenatal Transfer Tool  Maternal Diabetes: No Genetic Screening: Normal Maternal Ultrasounds/Referrals: Normal Fetal Ultrasounds or other Referrals:  None Maternal Substance Abuse:  No Significant Maternal Medications:  None Significant Maternal Lab Results:  None Other Comments:  None  Review of Systems  All other systems reviewed and are negative.      Blood pressure 135/89, pulse 98, temperature 97.9 F (36.6 C), temperature source Oral, last menstrual period 10/05/2011, SpO2 99.00%. Exam Physical Exam   GENERAL: Well-developed, well-nourished female in no acute distress.  HEENT: Normocephalic, atraumatic. Sclerae anicteric.  NECK: Supple. Normal thyroid.  LUNGS: Clear to auscultation bilaterally.  HEART: Regular rate and rhythm. BREASTS: Symmetric in size. No palpable masses or lymphadenopathy, skin changes, or nipple drainage. ABDOMEN: Soft, nontender,  gravid, obese.  PELVIC: Not indicated EXTREMITIES: No cyanosis, clubbing, or edema, 2+ distal pulses.  Prenatal labs: ABO, Rh: --/--/O NEG, O NEG (04/07 1200) Antibody: NEG (04/07 1200) Rubella: 236.5 (08/07 1616) RPR: NON REACTIVE (04/07 1200)  HBsAg: NEGATIVE (08/07 1616)  HIV: NON REACTIVE (01/13 1641)  GBS:   negative [id number]  Assessment/Plan: 32 yo G2P1 at 39 weeks here for scheduled repeat cesarean section and bilateral tubal ligation - Risks, benefits and alternatives were explained including but not limited to risks of bleeding, infection and damage to adjacent organs. Patient also desires permanent sterilization. Risks and benefits of procedure discussed with patient including permanence of method, bleeding, infection, injury to surrounding organs and need for additional procedures. Risk failure of 0.5-1% with increased risk of ectopic gestation if pregnancy occurs was also discussed with patient. Patient verbalized understanding and all questions were answered.    Alphonsus Doyel 07/16/2012, 8:31 AM

## 2012-07-16 NOTE — Op Note (Signed)
Patricia Compton PROCEDURE DATE: 07/16/2012  PREOPERATIVE DIAGNOSIS: Intrauterine pregnancy at  [redacted]w[redacted]d weeks gestation; elective repeat cesarean section and desire for permanent sterilization  POSTOPERATIVE DIAGNOSIS: The same  PROCEDURE:     Cesarean Section and bilateral tubal ligation  SURGEON:  Dr. Catalina Antigua  ASSISTANT: none   INDICATIONS: Patricia Compton is a 32 y.o. G2P1001 at [redacted]w[redacted]d scheduled for cesarean section secondary to elective repeat cesarean section.  The risks of cesarean section discussed with the patient included but were not limited to: bleeding which may require transfusion or reoperation; infection which may require antibiotics; injury to bowel, bladder, ureters or other surrounding organs; injury to the fetus; need for additional procedures including hysterectomy in the event of a life-threatening hemorrhage; placental abnormalities wth subsequent pregnancies, incisional problems, thromboembolic phenomenon and other postoperative/anesthesia complications. Patient also desires permanent sterilization. Risks and benefits of procedure discussed with patient including permanence of method, bleeding, infection, injury to surrounding organs and need for additional procedures. Risk failure of 0.5-1% with increased risk of ectopic gestation if pregnancy occurs was also discussed with patient. The patient concurred with the proposed plan, giving informed written consent for the procedure.    FINDINGS:  Viable female infant in cephalic presentation.  Apgars 9 and 9.  Clear amniotic fluid.  Intact placenta, three vessel cord.  Normal uterus, fallopian tubes and ovaries bilaterally.  ANESTHESIA:    Spinal INTRAVENOUS FLUIDS:3200 ml ESTIMATED BLOOD LOSS: 900 ml URINE OUTPUT:  100 ml SPECIMENS: Placenta sent to pathology/L&D COMPLICATIONS: None immediate  PROCEDURE IN DETAIL:  The patient received intravenous antibiotics and had sequential compression devices applied to her lower  extremities while in the preoperative area.  She was then taken to the operating room where anesthesia was induced and was found to be adequate. A foley catheter was placed into her bladder and attached to Patricia Compton gravity. She was then placed in a dorsal supine position with a leftward tilt, and prepped and draped in a sterile manner. After an adequate timeout was performed, a Pfannenstiel skin incision was made with scalpel and carried through to the underlying layer of fascia. The fascia was incised in the midline and this incision was extended bilaterally using the Mayo scissors. Kocher clamps were applied to the superior aspect of the fascial incision and the underlying rectus muscles were dissected off bluntly. A similar process was carried out on the inferior aspect of the facial incision. The rectus muscles were separated in the midline bluntly and the peritoneum was entered bluntly. The Alexis self-retaining retractor was introduced into the abdominal cavity. Attention was turned to the lower uterine segment where a bladder flap was created, and a transverse hysterotomy was made with a scalpel and extended bilaterally bluntly. The infant was successfully delivered, and cord was clamped and cut and infant was handed over to awaiting neonatology team. Uterine massage was then administered and the placenta delivered intact with three-vessel cord. The uterus was cleared of clot and debris.  The hysterotomy was closed with 0 Vicryl in a running locked fashion, and an imbricating layer was also placed with a 0 Vicryl. Overall, excellent hemostasis was noted. The patient's left fallopian tube was then identified, brought to the incision, and grasped with a Babcock clamp. The tube was then followed out to the fimbria. The Babcock clamp was then used to grasp the tube approximately 4 cm from the cornual region. A 3 cm segment of the tube was then ligated with free tie of plain gut suture, transected and excised.  Good hemostasis was noted and the tube was returned to the abdomen. The right fallopian tube was then identified to its fimbriated end, ligated, and a 3 cm segment excised in a similar fashion. Excellent hemostasis was noted, and the tube returned to the abdomen. The pelvis copiously irrigated and cleared of all clot and debris. Hemostasis was confirmed on all surfaces.  The peritoneum and the muscles were reapproximated using 0 vicryl interrupted stitches. The fascia was then closed using 0 Vicryl in a running locked fashion.  The subcutaneous layer was reapproximated with plain gut and the skin was closed in a subcuticular fashion using 3.0 Vicryl. The patient tolerated the procedure well. Sponge, lap, instrument and needle counts were correct x 2. She was taken to the recovery room in stable condition.    Patricia Compton,PEGGYMD  07/16/2012 10:13 AM

## 2012-07-16 NOTE — Transfer of Care (Signed)
Immediate Anesthesia Transfer of Care Note  Patient: Patricia Compton  Procedure(s) Performed: Procedure(s): BILATERAL TUBAL LIGATION (Bilateral) CESAREAN SECTION (N/A)  Patient Location: PACU  Anesthesia Type:Spinal  Level of Consciousness: awake, alert  and oriented  Airway & Oxygen Therapy: Patient Spontanous Breathing  Post-op Assessment: Report given to PACU RN and Post -op Vital signs reviewed and stable  Post vital signs: Reviewed and stable  Complications: No apparent anesthesia complications

## 2012-07-17 ENCOUNTER — Encounter (HOSPITAL_COMMUNITY): Payer: Self-pay | Admitting: Obstetrics and Gynecology

## 2012-07-17 LAB — CBC
MCH: 29 pg (ref 26.0–34.0)
MCHC: 32.7 g/dL (ref 30.0–36.0)
Platelets: 222 10*3/uL (ref 150–400)
RDW: 16.1 % — ABNORMAL HIGH (ref 11.5–15.5)

## 2012-07-17 LAB — BIRTH TISSUE RECOVERY COLLECTION (PLACENTA DONATION)

## 2012-07-17 MED ORDER — RHO D IMMUNE GLOBULIN 1500 UNIT/2ML IJ SOLN
300.0000 ug | Freq: Once | INTRAMUSCULAR | Status: AC
  Administered 2012-07-17: 300 ug via INTRAMUSCULAR
  Filled 2012-07-17: qty 2

## 2012-07-17 NOTE — Progress Notes (Signed)
I have seen and examined this patient and I agree with the above.  Bottlefeeding. Plan d/c tomorrow. Cam Hai 8:39 AM 07/17/2012

## 2012-07-17 NOTE — Progress Notes (Signed)
Post Op day 1  Subjective:  Noreen Mackintosh is 32 y.o. [phone] who underwent a RLTCS with BTL.  no complaints, up ad lib, voiding and tolerating PO  Objective: Blood pressure 117/77, pulse 69, temperature 99.1 F (37.3 C), temperature source Oral, resp. rate 18, weight 132.904 kg (293 lb), last menstrual period 10/05/2011, SpO2 97.00%, unknown if currently breastfeeding.  Physical Exam:  General: alert, cooperative and appears stated age Lochia: appropriate Uterine Fundus: firm Incision: healing well, no significant drainage DVT Evaluation: No evidence of DVT seen on physical exam. No cords or calf tenderness. No significant calf/ankle edema.   Recent Labs  07/15/12 1525 07/17/12 0605  HGB 11.3* 9.2*  HCT 34.4* 28.1*    Assessment/Plan: Contraception BTL  Plan for discharge in 1-2 days.    LOS: 1 day   Rosalee Kaufman 07/17/2012, 7:56 AM

## 2012-07-18 LAB — RH IG WORKUP (INCLUDES ABO/RH): Gestational Age(Wks): 39

## 2012-07-18 MED ORDER — OXYCODONE-ACETAMINOPHEN 5-325 MG PO TABS: 1.0000 | ORAL_TABLET | ORAL | Status: DC | PRN

## 2012-07-18 MED ORDER — FERROUS SULFATE 325 (65 FE) MG PO TABS: 325.0000 mg | ORAL_TABLET | Freq: Two times a day (BID) | ORAL | Status: DC

## 2012-07-18 MED ORDER — IBUPROFEN 600 MG PO TABS: 600.0000 mg | ORAL_TABLET | Freq: Four times a day (QID) | ORAL | Status: DC | PRN

## 2012-07-18 MED ORDER — DOCUSATE SODIUM 100 MG PO CAPS: 100.0000 mg | ORAL_CAPSULE | Freq: Two times a day (BID) | ORAL | Status: DC | PRN

## 2012-07-18 NOTE — Discharge Summary (Signed)
Obstetric Discharge Summary Reason for Admission: cesarean section Prenatal Procedures: none Intrapartum Procedures: cesarean: low cervical, transverse and tubal ligation Postpartum Procedures: none Complications-Operative and Postpartum: none Hemoglobin  Date Value Range Status  07/17/2012 9.2* 12.0 - 15.0 g/dL Final     DELTA CHECK NOTED     REPEATED TO VERIFY     HCT  Date Value Range Status  07/17/2012 28.1* 36.0 - 46.0 % Final    Physical Exam:  General: alert, cooperative and no distress Lochia: appropriate Uterine Fundus: firm Incision: healing well, no significant drainage, no dehiscence, no significant erythema DVT Evaluation: No evidence of DVT seen on physical exam. Negative Homan's sign. No cords or calf tenderness.  Discharge Diagnoses: Term Pregnancy-delivered via cesarean section  Discharge Information: Date: 07/18/2012 Activity: pelvic rest Diet: routine Medications: PNV, Ibuprofen, Colace, Iron and Percocet Condition: stable Instructions: refer to practice specific booklet Discharge to: home   Newborn Data: Live born female  Birth Weight: 8 lb 6.2 oz (3805 g) APGAR: 9, 9  Home with mother.  Levert Feinstein 07/18/2012, 7:53 AM  .I have seen the patient with the resident/student and agree with the above.  Tawnya Crook

## 2012-07-19 ENCOUNTER — Emergency Department: Payer: Self-pay | Admitting: Emergency Medicine

## 2012-09-12 ENCOUNTER — Ambulatory Visit (INDEPENDENT_AMBULATORY_CARE_PROVIDER_SITE_OTHER): Payer: BC Managed Care – PPO | Admitting: Family Medicine

## 2012-09-12 ENCOUNTER — Encounter: Payer: Self-pay | Admitting: Family Medicine

## 2012-09-12 NOTE — Progress Notes (Signed)
  Subjective:     Patricia Compton is a 32 y.o. female who presents for a postpartum visit. She is 6 weeks postpartum following a low cervical transverse Cesarean section. I have fully reviewed the prenatal and intrapartum course. The delivery was at 39 gestational weeks. Outcome: repeat cesarean section, low transverse incision. Anesthesia: spinal. Postpartum course has been unremarkable. Baby's course has been normal. Baby is feeding by bottle - Similac with Iron. Bleeding no bleeding. Bowel function is normal. Bladder function is normal. Patient is not sexually active. Contraception method is tubal ligation. Postpartum depression screening: negative.  The following portions of the patient's history were reviewed and updated as appropriate: allergies, current medications, past family history, past medical history, past social history, past surgical history and problem list.  Review of Systems Pertinent items are noted in HPI.   Objective:    BP 115/71  Pulse 57  Resp 16  Ht 5\' 5"  (1.651 m)  Wt 263 lb (119.296 kg)  BMI 43.77 kg/m2  LMP 09/03/2012  Breastfeeding? No  General:  alert, cooperative and appears stated age  Lungs: clear to auscultation bilaterally  Heart:  regular rate and rhythm, S1, S2 normal, no murmur, click, rub or gallop  Abdomen: soft, non-tender; bowel sounds normal; no masses,  no organomegaly   Vulva:  normal  Vagina: normal vagina  Cervix:  no cervical motion tenderness  Corpus: normal size, contour, position, consistency, mobility, non-tender  Adnexa:  no mass, fullness, tenderness        Assessment:     Normal postpartum exam. Pap smear not done at today's visit.   Plan:    1. Contraception: tubal ligation 2. Follow up in: 1 year for pap or as needed.

## 2012-09-12 NOTE — Patient Instructions (Signed)
Preventive Care for Adults, Female A healthy lifestyle and preventive care can promote health and wellness. Preventive health guidelines for women include the following key practices.  A routine yearly physical is a good way to check with your caregiver about your health and preventive screening. It is a chance to share any concerns and updates on your health, and to receive a thorough exam.  Visit your dentist for a routine exam and preventive care every 6 months. Brush your teeth twice a day and floss once a day. Good oral hygiene prevents tooth decay and gum disease.  The frequency of eye exams is based on your age, health, family medical history, use of contact lenses, and other factors. Follow your caregiver's recommendations for frequency of eye exams.  Eat a healthy diet. Foods like vegetables, fruits, whole grains, low-fat dairy products, and lean protein foods contain the nutrients you need without too many calories. Decrease your intake of foods high in solid fats, added sugars, and salt. Eat the right amount of calories for you.Get information about a proper diet from your caregiver, if necessary.  Regular physical exercise is one of the most important things you can do for your health. Most adults should get at least 150 minutes of moderate-intensity exercise (any activity that increases your heart rate and causes you to sweat) each week. In addition, most adults need muscle-strengthening exercises on 2 or more days a week.  Maintain a healthy weight. The body mass index (BMI) is a screening tool to identify possible weight problems. It provides an estimate of body fat based on height and weight. Your caregiver can help determine your BMI, and can help you achieve or maintain a healthy weight.For adults 20 years and older:  A BMI below 18.5 is considered underweight.  A BMI of 18.5 to 24.9 is normal.  A BMI of 25 to 29.9 is considered overweight.  A BMI of 30 and above is  considered obese.  Maintain normal blood lipids and cholesterol levels by exercising and minimizing your intake of saturated fat. Eat a balanced diet with plenty of fruit and vegetables. Blood tests for lipids and cholesterol should begin at age 20 and be repeated every 5 years. If your lipid or cholesterol levels are high, you are over 50, or you are at high risk for heart disease, you may need your cholesterol levels checked more frequently.Ongoing high lipid and cholesterol levels should be treated with medicines if diet and exercise are not effective.  If you smoke, find out from your caregiver how to quit. If you do not use tobacco, do not start.  If you are pregnant, do not drink alcohol. If you are breastfeeding, be very cautious about drinking alcohol. If you are not pregnant and choose to drink alcohol, do not exceed 1 drink per day. One drink is considered to be 12 ounces (355 mL) of beer, 5 ounces (148 mL) of wine, or 1.5 ounces (44 mL) of liquor.  Avoid use of street drugs. Do not share needles with anyone. Ask for help if you need support or instructions about stopping the use of drugs.  High blood pressure causes heart disease and increases the risk of stroke. Your blood pressure should be checked at least every 1 to 2 years. Ongoing high blood pressure should be treated with medicines if weight loss and exercise are not effective.  If you are 55 to 33 years old, ask your caregiver if you should take aspirin to prevent strokes.  Diabetes   screening involves taking a blood sample to check your fasting blood sugar level. This should be done once every 3 years, after age 45, if you are within normal weight and without risk factors for diabetes. Testing should be considered at a younger age or be carried out more frequently if you are overweight and have at least 1 risk factor for diabetes.  Breast cancer screening is essential preventive care for women. You should practice "breast  self-awareness." This means understanding the normal appearance and feel of your breasts and may include breast self-examination. Any changes detected, no matter how small, should be reported to a caregiver. Women in their 20s and 30s should have a clinical breast exam (CBE) by a caregiver as part of a regular health exam every 1 to 3 years. After age 40, women should have a CBE every year. Starting at age 40, women should consider having a mammography (breast X-ray test) every year. Women who have a family history of breast cancer should talk to their caregiver about genetic screening. Women at a high risk of breast cancer should talk to their caregivers about having magnetic resonance imaging (MRI) and a mammography every year.  The Pap test is a screening test for cervical cancer. A Pap test can show cell changes on the cervix that might become cervical cancer if left untreated. A Pap test is a procedure in which cells are obtained and examined from the lower end of the uterus (cervix).  Women should have a Pap test starting at age 21.  Between ages 21 and 29, Pap tests should be repeated every 2 years.  Beginning at age 30, you should have a Pap test every 3 years as long as the past 3 Pap tests have been normal.  Some women have medical problems that increase the chance of getting cervical cancer. Talk to your caregiver about these problems. It is especially important to talk to your caregiver if a new problem develops soon after your last Pap test. In these cases, your caregiver may recommend more frequent screening and Pap tests.  The above recommendations are the same for women who have or have not gotten the vaccine for human papillomavirus (HPV).  If you had a hysterectomy for a problem that was not cancer or a condition that could lead to cancer, then you no longer need Pap tests. Even if you no longer need a Pap test, a regular exam is a good idea to make sure no other problems are  starting.  If you are between ages 65 and 70, and you have had normal Pap tests going back 10 years, you no longer need Pap tests. Even if you no longer need a Pap test, a regular exam is a good idea to make sure no other problems are starting.  If you have had past treatment for cervical cancer or a condition that could lead to cancer, you need Pap tests and screening for cancer for at least 20 years after your treatment.  If Pap tests have been discontinued, risk factors (such as a new sexual partner) need to be reassessed to determine if screening should be resumed.  The HPV test is an additional test that may be used for cervical cancer screening. The HPV test looks for the virus that can cause the cell changes on the cervix. The cells collected during the Pap test can be tested for HPV. The HPV test could be used to screen women aged 30 years and older, and should   be used in women of any age who have unclear Pap test results. After the age of 30, women should have HPV testing at the same frequency as a Pap test.  Colorectal cancer can be detected and often prevented. Most routine colorectal cancer screening begins at the age of 50 and continues through age 75. However, your caregiver may recommend screening at an earlier age if you have risk factors for colon cancer. On a yearly basis, your caregiver may provide home test kits to check for hidden blood in the stool. Use of a small camera at the end of a tube, to directly examine the colon (sigmoidoscopy or colonoscopy), can detect the earliest forms of colorectal cancer. Talk to your caregiver about this at age 50, when routine screening begins. Direct examination of the colon should be repeated every 5 to 10 years through age 75, unless early forms of pre-cancerous polyps or small growths are found.  Hepatitis C blood testing is recommended for all people born from 1945 through 1965 and any individual with known risks for hepatitis C.  Practice  safe sex. Use condoms and avoid high-risk sexual practices to reduce the spread of sexually transmitted infections (STIs). STIs include gonorrhea, chlamydia, syphilis, trichomonas, herpes, HPV, and human immunodeficiency virus (HIV). Herpes, HIV, and HPV are viral illnesses that have no cure. They can result in disability, cancer, and death. Sexually active women aged 25 and younger should be checked for chlamydia. Older women with new or multiple partners should also be tested for chlamydia. Testing for other STIs is recommended if you are sexually active and at increased risk.  Osteoporosis is a disease in which the bones lose minerals and strength with aging. This can result in serious bone fractures. The risk of osteoporosis can be identified using a bone density scan. Women ages 65 and over and women at risk for fractures or osteoporosis should discuss screening with their caregivers. Ask your caregiver whether you should take a calcium supplement or vitamin D to reduce the rate of osteoporosis.  Menopause can be associated with physical symptoms and risks. Hormone replacement therapy is available to decrease symptoms and risks. You should talk to your caregiver about whether hormone replacement therapy is right for you.  Use sunscreen with sun protection factor (SPF) of 30 or more. Apply sunscreen liberally and repeatedly throughout the day. You should seek shade when your shadow is shorter than you. Protect yourself by wearing long sleeves, pants, a wide-brimmed hat, and sunglasses year round, whenever you are outdoors.  Once a month, do a whole body skin exam, using a mirror to look at the skin on your back. Notify your caregiver of new moles, moles that have irregular borders, moles that are larger than a pencil eraser, or moles that have changed in shape or color.  Stay current with required immunizations.  Influenza. You need a dose every fall (or winter). The composition of the flu vaccine  changes each year, so being vaccinated once is not enough.  Pneumococcal polysaccharide. You need 1 to 2 doses if you smoke cigarettes or if you have certain chronic medical conditions. You need 1 dose at age 65 (or older) if you have never been vaccinated.  Tetanus, diphtheria, pertussis (Tdap, Td). Get 1 dose of Tdap vaccine if you are younger than age 65, are over 65 and have contact with an infant, are a healthcare worker, are pregnant, or simply want to be protected from whooping cough. After that, you need a Td   booster dose every 10 years. Consult your caregiver if you have not had at least 3 tetanus and diphtheria-containing shots sometime in your life or have a deep or dirty wound.  HPV. You need this vaccine if you are a woman age 26 or younger. The vaccine is given in 3 doses over 6 months.  Measles, mumps, rubella (MMR). You need at least 1 dose of MMR if you were born in 1957 or later. You may also need a second dose.  Meningococcal. If you are age 19 to 21 and a first-year college student living in a residence hall, or have one of several medical conditions, you need to get vaccinated against meningococcal disease. You may also need additional booster doses.  Zoster (shingles). If you are age 60 or older, you should get this vaccine.  Varicella (chickenpox). If you have never had chickenpox or you were vaccinated but received only 1 dose, talk to your caregiver to find out if you need this vaccine.  Hepatitis A. You need this vaccine if you have a specific risk factor for hepatitis A virus infection or you simply wish to be protected from this disease. The vaccine is usually given as 2 doses, 6 to 18 months apart.  Hepatitis B. You need this vaccine if you have a specific risk factor for hepatitis B virus infection or you simply wish to be protected from this disease. The vaccine is given in 3 doses, usually over 6 months. Preventive Services / Frequency Ages 19 to 39  Blood  pressure check.** / Every 1 to 2 years.  Lipid and cholesterol check.** / Every 5 years beginning at age 20.  Clinical breast exam.** / Every 3 years for women in their 20s and 30s.  Pap test.** / Every 2 years from ages 21 through 29. Every 3 years starting at age 30 through age 65 or 70 with a history of 3 consecutive normal Pap tests.  HPV screening.** / Every 3 years from ages 30 through ages 65 to 70 with a history of 3 consecutive normal Pap tests.  Hepatitis C blood test.** / For any individual with known risks for hepatitis C.  Skin self-exam. / Monthly.  Influenza immunization.** / Every year.  Pneumococcal polysaccharide immunization.** / 1 to 2 doses if you smoke cigarettes or if you have certain chronic medical conditions.  Tetanus, diphtheria, pertussis (Tdap, Td) immunization. / A one-time dose of Tdap vaccine. After that, you need a Td booster dose every 10 years.  HPV immunization. / 3 doses over 6 months, if you are 26 and younger.  Measles, mumps, rubella (MMR) immunization. / You need at least 1 dose of MMR if you were born in 1957 or later. You may also need a second dose.  Meningococcal immunization. / 1 dose if you are age 19 to 21 and a first-year college student living in a residence hall, or have one of several medical conditions, you need to get vaccinated against meningococcal disease. You may also need additional booster doses.  Varicella immunization.** / Consult your caregiver.  Hepatitis A immunization.** / Consult your caregiver. 2 doses, 6 to 18 months apart.  Hepatitis B immunization.** / Consult your caregiver. 3 doses usually over 6 months. Ages 40 to 64  Blood pressure check.** / Every 1 to 2 years.  Lipid and cholesterol check.** / Every 5 years beginning at age 20.  Clinical breast exam.** / Every year after age 40.  Mammogram.** / Every year beginning at age 40   and continuing for as long as you are in good health. Consult with your  caregiver.  Pap test.** / Every 3 years starting at age 30 through age 65 or 70 with a history of 3 consecutive normal Pap tests.  HPV screening.** / Every 3 years from ages 30 through ages 65 to 70 with a history of 3 consecutive normal Pap tests.  Fecal occult blood test (FOBT) of stool. / Every year beginning at age 50 and continuing until age 75. You may not need to do this test if you get a colonoscopy every 10 years.  Flexible sigmoidoscopy or colonoscopy.** / Every 5 years for a flexible sigmoidoscopy or every 10 years for a colonoscopy beginning at age 50 and continuing until age 75.  Hepatitis C blood test.** / For all people born from 1945 through 1965 and any individual with known risks for hepatitis C.  Skin self-exam. / Monthly.  Influenza immunization.** / Every year.  Pneumococcal polysaccharide immunization.** / 1 to 2 doses if you smoke cigarettes or if you have certain chronic medical conditions.  Tetanus, diphtheria, pertussis (Tdap, Td) immunization.** / A one-time dose of Tdap vaccine. After that, you need a Td booster dose every 10 years.  Measles, mumps, rubella (MMR) immunization. / You need at least 1 dose of MMR if you were born in 1957 or later. You may also need a second dose.  Varicella immunization.** / Consult your caregiver.  Meningococcal immunization.** / Consult your caregiver.  Hepatitis A immunization.** / Consult your caregiver. 2 doses, 6 to 18 months apart.  Hepatitis B immunization.** / Consult your caregiver. 3 doses, usually over 6 months. Ages 65 and over  Blood pressure check.** / Every 1 to 2 years.  Lipid and cholesterol check.** / Every 5 years beginning at age 20.  Clinical breast exam.** / Every year after age 40.  Mammogram.** / Every year beginning at age 40 and continuing for as long as you are in good health. Consult with your caregiver.  Pap test.** / Every 3 years starting at age 30 through age 65 or 70 with a 3  consecutive normal Pap tests. Testing can be stopped between 65 and 70 with 3 consecutive normal Pap tests and no abnormal Pap or HPV tests in the past 10 years.  HPV screening.** / Every 3 years from ages 30 through ages 65 or 70 with a history of 3 consecutive normal Pap tests. Testing can be stopped between 65 and 70 with 3 consecutive normal Pap tests and no abnormal Pap or HPV tests in the past 10 years.  Fecal occult blood test (FOBT) of stool. / Every year beginning at age 50 and continuing until age 75. You may not need to do this test if you get a colonoscopy every 10 years.  Flexible sigmoidoscopy or colonoscopy.** / Every 5 years for a flexible sigmoidoscopy or every 10 years for a colonoscopy beginning at age 50 and continuing until age 75.  Hepatitis C blood test.** / For all people born from 1945 through 1965 and any individual with known risks for hepatitis C.  Osteoporosis screening.** / A one-time screening for women ages 65 and over and women at risk for fractures or osteoporosis.  Skin self-exam. / Monthly.  Influenza immunization.** / Every year.  Pneumococcal polysaccharide immunization.** / 1 dose at age 65 (or older) if you have never been vaccinated.  Tetanus, diphtheria, pertussis (Tdap, Td) immunization. / A one-time dose of Tdap vaccine if you are over   65 and have contact with an infant, are a healthcare worker, or simply want to be protected from whooping cough. After that, you need a Td booster dose every 10 years.  Varicella immunization.** / Consult your caregiver.  Meningococcal immunization.** / Consult your caregiver.  Hepatitis A immunization.** / Consult your caregiver. 2 doses, 6 to 18 months apart.  Hepatitis B immunization.** / Check with your caregiver. 3 doses, usually over 6 months. ** Family history and personal history of risk and conditions may change your caregiver's recommendations. Document Released: 05/23/2001 Document Revised: 06/19/2011  Document Reviewed: 08/22/2010 [employer] Patient Information 2014 [employer].  

## 2013-02-13 ENCOUNTER — Other Ambulatory Visit: Payer: Self-pay

## 2014-02-09 ENCOUNTER — Encounter: Payer: Self-pay | Admitting: Family Medicine

## 2014-10-08 ENCOUNTER — Encounter: Payer: Self-pay | Admitting: Family Medicine

## 2014-10-08 ENCOUNTER — Ambulatory Visit (INDEPENDENT_AMBULATORY_CARE_PROVIDER_SITE_OTHER): Payer: BC Managed Care – PPO | Admitting: Family Medicine

## 2014-10-08 VITALS — BP 115/79 | HR 56 | Wt 239.0 lb

## 2014-10-08 DIAGNOSIS — Z01419 Encounter for gynecological examination (general) (routine) without abnormal findings: Secondary | ICD-10-CM | POA: Diagnosis not present

## 2014-10-08 DIAGNOSIS — Z1151 Encounter for screening for human papillomavirus (HPV): Secondary | ICD-10-CM | POA: Diagnosis not present

## 2014-10-08 DIAGNOSIS — Z124 Encounter for screening for malignant neoplasm of cervix: Secondary | ICD-10-CM | POA: Diagnosis not present

## 2014-10-08 LAB — LIPID PANEL
CHOLESTEROL: 167 mg/dL (ref 0–200)
HDL: 38 mg/dL — ABNORMAL LOW (ref >=46)
LDL Cholesterol: 108 mg/dL — ABNORMAL HIGH (ref 0–99)
Total CHOL/HDL Ratio: 4.4 Ratio
Triglycerides: 103 mg/dL (ref <150)
VLDL: 21 mg/dL (ref 0–40)

## 2014-10-08 LAB — CBC
HCT: 38.3 % (ref 36.0–46.0)
Hemoglobin: 12.7 g/dL (ref 12.0–15.0)
MCH: 30.5 pg (ref 26.0–34.0)
MCHC: 33.2 g/dL (ref 30.0–36.0)
MCV: 91.8 fL (ref 78.0–100.0)
MPV: 9.3 fL (ref 8.6–12.4)
PLATELETS: 297 10*3/uL (ref 150–400)
RBC: 4.17 MIL/uL (ref 3.87–5.11)
RDW: 13.9 % (ref 11.5–15.5)
WBC: 4.6 10*3/uL (ref 4.0–10.5)

## 2014-10-08 LAB — COMPREHENSIVE METABOLIC PANEL
ALBUMIN: 4 g/dL (ref 3.5–5.2)
ALK PHOS: 45 U/L (ref 39–117)
ALT: 11 U/L (ref 0–35)
AST: 11 U/L (ref 0–37)
BUN: 10 mg/dL (ref 6–23)
CALCIUM: 8.8 mg/dL (ref 8.4–10.5)
CO2: 26 meq/L (ref 19–32)
CREATININE: 0.64 mg/dL (ref 0.50–1.10)
Chloride: 105 mEq/L (ref 96–112)
Glucose, Bld: 77 mg/dL (ref 70–99)
Potassium: 3.8 mEq/L (ref 3.5–5.3)
SODIUM: 142 meq/L (ref 135–145)
TOTAL PROTEIN: 6.6 g/dL (ref 6.0–8.3)
Total Bilirubin: 0.5 mg/dL (ref 0.2–1.2)

## 2014-10-08 LAB — TSH: TSH: 2.879 u[IU]/mL (ref 0.350–4.500)

## 2014-10-08 NOTE — Progress Notes (Signed)
  Subjective:     Patricia Compton is a 34 y.o. female and is here for a comprehensive physical exam. The patient reports no problems. S/p BTL. Irregular cycles q 3-5 wks, last 2-3 days only.  History   Social History  . Marital Status: Married    Spouse Name: N/A  . Number of Children: N/A  . Years of Education: N/A   Occupational History  . Not on file.   Social History Main Topics  . Smoking status: Never Smoker   . Smokeless tobacco: Not on file  . Alcohol Use: No  . Drug Use: No  . Sexual Activity:    Partners: Male    Birth Control/ Protection: Surgical   Other Topics Concern  . Not on file   Social History Narrative   Health Maintenance  Topic Date Due  . PAP SMEAR  08/01/2014  . INFLUENZA VACCINE  11/09/2014  . TETANUS/TDAP  04/22/2022  . HIV Screening  Completed    The following portions of the patient's history were reviewed and updated as appropriate: allergies, current medications, past family history, past medical history, past social history, past surgical history and problem list.  Review of Systems A comprehensive review of systems was negative.   Objective:    BP 115/79 mmHg  Pulse 56  Wt 239 lb (108.41 kg)  LMP 10/02/2014 General appearance: alert, cooperative, appears stated age and moderately obese Head: Normocephalic, without obvious abnormality, atraumatic Neck: no adenopathy, supple, symmetrical, trachea midline and thyroid not enlarged, symmetric, no tenderness/mass/nodules Lungs: clear to auscultation bilaterally Breasts: normal appearance, no masses or tenderness Heart: regular rate and rhythm, S1, S2 normal, no murmur, click, rub or gallop Abdomen: soft, non-tender; bowel sounds normal; no masses,  no organomegaly Pelvic: cervix normal in appearance, external genitalia normal, no adnexal masses or tenderness, no cervical motion tenderness, uterus normal size, shape, and consistency and vagina normal without discharge Extremities:  extremities normal, atraumatic, no cyanosis or edema Pulses: 2+ and symmetric Skin: Skin color, texture, turgor normal. No rashes or lesions Lymph nodes: Cervical, supraclavicular, and axillary nodes normal. Neurologic: Grossly normal    Assessment:    Healthy female exam.      Plan:   Problem List Items Addressed This Visit    None    Visit Diagnoses    Encounter for routine gynecological examination    -  Primary    Relevant Orders    TSH    CBC    Comprehensive metabolic panel    Lipid panel    Screening for malignant neoplasm of cervix        Relevant Orders    Cytology - PAP         See After Visit Summary for Counseling Recommendations

## 2014-10-08 NOTE — Patient Instructions (Signed)
Preventive Care for Adults A healthy lifestyle and preventive care can promote health and wellness. Preventive health guidelines for women include the following key practices.  A routine yearly physical is a good way to check with your health care provider about your health and preventive screening. It is a chance to share any concerns and updates on your health and to receive a thorough exam.  Visit your dentist for a routine exam and preventive care every 6 months. Brush your teeth twice a day and floss once a day. Good oral hygiene prevents tooth decay and gum disease.  The frequency of eye exams is based on your age, health, family medical history, use of contact lenses, and other factors. Follow your health care provider's recommendations for frequency of eye exams.  Eat a healthy diet. Foods like vegetables, fruits, whole grains, low-fat dairy products, and lean protein foods contain the nutrients you need without too many calories. Decrease your intake of foods high in solid fats, added sugars, and salt. Eat the right amount of calories for you.Get information about a proper diet from your health care provider, if necessary.  Regular physical exercise is one of the most important things you can do for your health. Most adults should get at least 150 minutes of moderate-intensity exercise (any activity that increases your heart rate and causes you to sweat) each week. In addition, most adults need muscle-strengthening exercises on 2 or more days a week.  Maintain a healthy weight. The body mass index (BMI) is a screening tool to identify possible weight problems. It provides an estimate of body fat based on height and weight. Your health care provider can find your BMI and can help you achieve or maintain a healthy weight.For adults 20 years and older:  A BMI below 18.5 is considered underweight.  A BMI of 18.5 to 24.9 is normal.  A BMI of 25 to 29.9 is considered overweight.  A BMI of  30 and above is considered obese.  Maintain normal blood lipids and cholesterol levels by exercising and minimizing your intake of saturated fat. Eat a balanced diet with plenty of fruit and vegetables. Blood tests for lipids and cholesterol should begin at age 20 and be repeated every 5 years. If your lipid or cholesterol levels are high, you are over 50, or you are at high risk for heart disease, you may need your cholesterol levels checked more frequently.Ongoing high lipid and cholesterol levels should be treated with medicines if diet and exercise are not working.  If you smoke, find out from your health care provider how to quit. If you do not use tobacco, do not start.  Lung cancer screening is recommended for adults aged 55-80 years who are at high risk for developing lung cancer because of a history of smoking. A yearly low-dose CT scan of the lungs is recommended for people who have at least a 30-pack-year history of smoking and are a current smoker or have quit within the past 15 years. A pack year of smoking is smoking an average of 1 pack of cigarettes a day for 1 year (for example: 1 pack a day for 30 years or 2 packs a day for 15 years). Yearly screening should continue until the smoker has stopped smoking for at least 15 years. Yearly screening should be stopped for people who develop a health problem that would prevent them from having lung cancer treatment.  If you are pregnant, do not drink alcohol. If you are breastfeeding,   be very cautious about drinking alcohol. If you are not pregnant and choose to drink alcohol, do not have more than 1 drink per day. One drink is considered to be 12 ounces (355 mL) of beer, 5 ounces (148 mL) of wine, or 1.5 ounces (44 mL) of liquor.  Avoid use of street drugs. Do not share needles with anyone. Ask for help if you need support or instructions about stopping the use of drugs.  High blood pressure causes heart disease and increases the risk of  stroke. Your blood pressure should be checked at least every 1 to 2 years. Ongoing high blood pressure should be treated with medicines if weight loss and exercise do not work.  If you are 75-52 years old, ask your health care provider if you should take aspirin to prevent strokes.  Diabetes screening involves taking a blood sample to check your fasting blood sugar level. This should be done once every 3 years, after age 15, if you are within normal weight and without risk factors for diabetes. Testing should be considered at a younger age or be carried out more frequently if you are overweight and have at least 1 risk factor for diabetes.  Breast cancer screening is essential preventive care for women. You should practice "breast self-awareness." This means understanding the normal appearance and feel of your breasts and may include breast self-examination. Any changes detected, no matter how small, should be reported to a health care provider. Women in their 58s and 30s should have a clinical breast exam (CBE) by a health care provider as part of a regular health exam every 1 to 3 years. After age 16, women should have a CBE every year. Starting at age 53, women should consider having a mammogram (breast X-ray test) every year. Women who have a family history of breast cancer should talk to their health care provider about genetic screening. Women at a high risk of breast cancer should talk to their health care providers about having an MRI and a mammogram every year.  Breast cancer gene (BRCA)-related cancer risk assessment is recommended for women who have family members with BRCA-related cancers. BRCA-related cancers include breast, ovarian, tubal, and peritoneal cancers. Having family members with these cancers may be associated with an increased risk for harmful changes (mutations) in the breast cancer genes BRCA1 and BRCA2. Results of the assessment will determine the need for genetic counseling and  BRCA1 and BRCA2 testing.  Routine pelvic exams to screen for cancer are no longer recommended for nonpregnant women who are considered low risk for cancer of the pelvic organs (ovaries, uterus, and vagina) and who do not have symptoms. Ask your health care provider if a screening pelvic exam is right for you.  If you have had past treatment for cervical cancer or a condition that could lead to cancer, you need Pap tests and screening for cancer for at least 20 years after your treatment. If Pap tests have been discontinued, your risk factors (such as having a new sexual partner) need to be reassessed to determine if screening should be resumed. Some women have medical problems that increase the chance of getting cervical cancer. In these cases, your health care provider may recommend more frequent screening and Pap tests.  The HPV test is an additional test that may be used for cervical cancer screening. The HPV test looks for the virus that can cause the cell changes on the cervix. The cells collected during the Pap test can be  tested for HPV. The HPV test could be used to screen women aged 30 years and older, and should be used in women of any age who have unclear Pap test results. After the age of 30, women should have HPV testing at the same frequency as a Pap test.  Colorectal cancer can be detected and often prevented. Most routine colorectal cancer screening begins at the age of 50 years and continues through age 75 years. However, your health care provider may recommend screening at an earlier age if you have risk factors for colon cancer. On a yearly basis, your health care provider may provide home test kits to check for hidden blood in the stool. Use of a small camera at the end of a tube, to directly examine the colon (sigmoidoscopy or colonoscopy), can detect the earliest forms of colorectal cancer. Talk to your health care provider about this at age 50, when routine screening begins. Direct  exam of the colon should be repeated every 5-10 years through age 75 years, unless early forms of pre-cancerous polyps or small growths are found.  People who are at an increased risk for hepatitis B should be screened for this virus. You are considered at high risk for hepatitis B if:  You were born in a country where hepatitis B occurs often. Talk with your health care provider about which countries are considered high risk.  Your parents were born in a high-risk country and you have not received a shot to protect against hepatitis B (hepatitis B vaccine).  You have HIV or AIDS.  You use needles to inject street drugs.  You live with, or have sex with, someone who has hepatitis B.  You get hemodialysis treatment.  You take certain medicines for conditions like cancer, organ transplantation, and autoimmune conditions.  Hepatitis C blood testing is recommended for all people born from 1945 through 1965 and any individual with known risks for hepatitis C.  Practice safe sex. Use condoms and avoid high-risk sexual practices to reduce the spread of sexually transmitted infections (STIs). STIs include gonorrhea, chlamydia, syphilis, trichomonas, herpes, HPV, and human immunodeficiency virus (HIV). Herpes, HIV, and HPV are viral illnesses that have no cure. They can result in disability, cancer, and death.  You should be screened for sexually transmitted illnesses (STIs) including gonorrhea and chlamydia if:  You are sexually active and are younger than 24 years.  You are older than 24 years and your health care provider tells you that you are at risk for this type of infection.  Your sexual activity has changed since you were last screened and you are at an increased risk for chlamydia or gonorrhea. Ask your health care provider if you are at risk.  If you are at risk of being infected with HIV, it is recommended that you take a prescription medicine daily to prevent HIV infection. This is  called preexposure prophylaxis (PrEP). You are considered at risk if:  You are a heterosexual woman, are sexually active, and are at increased risk for HIV infection.  You take drugs by injection.  You are sexually active with a partner who has HIV.  Talk with your health care provider about whether you are at high risk of being infected with HIV. If you choose to begin PrEP, you should first be tested for HIV. You should then be tested every 3 months for as long as you are taking PrEP.  Osteoporosis is a disease in which the bones lose minerals and strength   with aging. This can result in serious bone fractures or breaks. The risk of osteoporosis can be identified using a bone density scan. Women ages 65 years and over and women at risk for fractures or osteoporosis should discuss screening with their health care providers. Ask your health care provider whether you should take a calcium supplement or vitamin D to reduce the rate of osteoporosis.  Menopause can be associated with physical symptoms and risks. Hormone replacement therapy is available to decrease symptoms and risks. You should talk to your health care provider about whether hormone replacement therapy is right for you.  Use sunscreen. Apply sunscreen liberally and repeatedly throughout the day. You should seek shade when your shadow is shorter than you. Protect yourself by wearing long sleeves, pants, a wide-brimmed hat, and sunglasses year round, whenever you are outdoors.  Once a month, do a whole body skin exam, using a mirror to look at the skin on your back. Tell your health care provider of new moles, moles that have irregular borders, moles that are larger than a pencil eraser, or moles that have changed in shape or color.  Stay current with required vaccines (immunizations).  Influenza vaccine. All adults should be immunized every year.  Tetanus, diphtheria, and acellular pertussis (Td, Tdap) vaccine. Pregnant women should  receive 1 dose of Tdap vaccine during each pregnancy. The dose should be obtained regardless of the length of time since the last dose. Immunization is preferred during the 27th-36th week of gestation. An adult who has not previously received Tdap or who does not know her vaccine status should receive 1 dose of Tdap. This initial dose should be followed by tetanus and diphtheria toxoids (Td) booster doses every 10 years. Adults with an unknown or incomplete history of completing a 3-dose immunization series with Td-containing vaccines should begin or complete a primary immunization series including a Tdap dose. Adults should receive a Td booster every 10 years.  Varicella vaccine. An adult without evidence of immunity to varicella should receive 2 doses or a second dose if she has previously received 1 dose. Pregnant females who do not have evidence of immunity should receive the first dose after pregnancy. This first dose should be obtained before leaving the health care facility. The second dose should be obtained 4-8 weeks after the first dose.  Human papillomavirus (HPV) vaccine. Females aged 13-26 years who have not received the vaccine previously should obtain the 3-dose series. The vaccine is not recommended for use in pregnant females. However, pregnancy testing is not needed before receiving a dose. If a female is found to be pregnant after receiving a dose, no treatment is needed. In that case, the remaining doses should be delayed until after the pregnancy. Immunization is recommended for any person with an immunocompromised condition through the age of 26 years if she did not get any or all doses earlier. During the 3-dose series, the second dose should be obtained 4-8 weeks after the first dose. The third dose should be obtained 24 weeks after the first dose and 16 weeks after the second dose.  Zoster vaccine. One dose is recommended for adults aged 60 years or older unless certain conditions are  present.  Measles, mumps, and rubella (MMR) vaccine. Adults born before 1957 generally are considered immune to measles and mumps. Adults born in 1957 or later should have 1 or more doses of MMR vaccine unless there is a contraindication to the vaccine or there is laboratory evidence of immunity to   each of the three diseases. A routine second dose of MMR vaccine should be obtained at least 28 days after the first dose for students attending postsecondary schools, health care workers, or international travelers. People who received inactivated measles vaccine or an unknown type of measles vaccine during 1963-1967 should receive 2 doses of MMR vaccine. People who received inactivated mumps vaccine or an unknown type of mumps vaccine before 1979 and are at high risk for mumps infection should consider immunization with 2 doses of MMR vaccine. For females of childbearing age, rubella immunity should be determined. If there is no evidence of immunity, females who are not pregnant should be vaccinated. If there is no evidence of immunity, females who are pregnant should delay immunization until after pregnancy. Unvaccinated health care workers born before 1957 who lack laboratory evidence of measles, mumps, or rubella immunity or laboratory confirmation of disease should consider measles and mumps immunization with 2 doses of MMR vaccine or rubella immunization with 1 dose of MMR vaccine.  Pneumococcal 13-valent conjugate (PCV13) vaccine. When indicated, a person who is uncertain of her immunization history and has no record of immunization should receive the PCV13 vaccine. An adult aged 19 years or older who has certain medical conditions and has not been previously immunized should receive 1 dose of PCV13 vaccine. This PCV13 should be followed with a dose of pneumococcal polysaccharide (PPSV23) vaccine. The PPSV23 vaccine dose should be obtained at least 8 weeks after the dose of PCV13 vaccine. An adult aged 19  years or older who has certain medical conditions and previously received 1 or more doses of PPSV23 vaccine should receive 1 dose of PCV13. The PCV13 vaccine dose should be obtained 1 or more years after the last PPSV23 vaccine dose.  Pneumococcal polysaccharide (PPSV23) vaccine. When PCV13 is also indicated, PCV13 should be obtained first. All adults aged 65 years and older should be immunized. An adult younger than age 65 years who has certain medical conditions should be immunized. Any person who resides in a nursing home or long-term care facility should be immunized. An adult smoker should be immunized. People with an immunocompromised condition and certain other conditions should receive both PCV13 and PPSV23 vaccines. People with human immunodeficiency virus (HIV) infection should be immunized as soon as possible after diagnosis. Immunization during chemotherapy or radiation therapy should be avoided. Routine use of PPSV23 vaccine is not recommended for American Indians, [place] Natives, or people younger than 65 years unless there are medical conditions that require PPSV23 vaccine. When indicated, people who have unknown immunization and have no record of immunization should receive PPSV23 vaccine. One-time revaccination 5 years after the first dose of PPSV23 is recommended for people aged 19-64 years who have chronic kidney failure, nephrotic syndrome, asplenia, or immunocompromised conditions. People who received 1-2 doses of PPSV23 before age 65 years should receive another dose of PPSV23 vaccine at age 65 years or later if at least 5 years have passed since the previous dose. Doses of PPSV23 are not needed for people immunized with PPSV23 at or after age 65 years.  Meningococcal vaccine. Adults with asplenia or persistent complement component deficiencies should receive 2 doses of quadrivalent meningococcal conjugate (MenACWY-D) vaccine. The doses should be obtained at least 2 months apart.  Microbiologists working with certain meningococcal bacteria, military recruits, people at risk during an outbreak, and people who travel to or live in countries with a high rate of meningitis should be immunized. A first-year college student up through age   21 years who is living in a residence hall should receive a dose if she did not receive a dose on or after her 16th birthday. Adults who have certain high-risk conditions should receive one or more doses of vaccine.  Hepatitis A vaccine. Adults who wish to be protected from this disease, have certain high-risk conditions, work with hepatitis A-infected animals, work in hepatitis A research labs, or travel to or work in countries with a high rate of hepatitis A should be immunized. Adults who were previously unvaccinated and who anticipate close contact with an international adoptee during the first 60 days after arrival in the [place] from a country with a high rate of hepatitis A should be immunized.  Hepatitis B vaccine. Adults who wish to be protected from this disease, have certain high-risk conditions, may be exposed to blood or other infectious body fluids, are household contacts or sex partners of hepatitis B positive people, are clients or workers in certain care facilities, or travel to or work in countries with a high rate of hepatitis B should be immunized.  Haemophilus influenzae type b (Hib) vaccine. A previously unvaccinated person with asplenia or sickle cell disease or having a scheduled splenectomy should receive 1 dose of Hib vaccine. Regardless of previous immunization, a recipient of a hematopoietic stem cell transplant should receive a 3-dose series 6-12 months after her successful transplant. Hib vaccine is not recommended for adults with HIV infection. Preventive Services / Frequency Ages 64 to 68 years  Blood pressure check.** / Every 1 to 2 years.  Lipid and cholesterol check.** / Every 5 years beginning at age  22.  Clinical breast exam.** / Every 3 years for women in their 88s and 53s.  BRCA-related cancer risk assessment.** / For women who have family members with a BRCA-related cancer (breast, ovarian, tubal, or peritoneal cancers).  Pap test.** / Every 2 years from ages 90 through 51. Every 3 years starting at age 21 through age 56 or 3 with a history of 3 consecutive normal Pap tests.  HPV screening.** / Every 3 years from ages 24 through ages 1 to 46 with a history of 3 consecutive normal Pap tests.  Hepatitis C blood test.** / For any individual with known risks for hepatitis C.  Skin self-exam. / Monthly.  Influenza vaccine. / Every year.  Tetanus, diphtheria, and acellular pertussis (Tdap, Td) vaccine.** / Consult your health care provider. Pregnant women should receive 1 dose of Tdap vaccine during each pregnancy. 1 dose of Td every 10 years.  Varicella vaccine.** / Consult your health care provider. Pregnant females who do not have evidence of immunity should receive the first dose after pregnancy.  HPV vaccine. / 3 doses over 6 months, if 72 and younger. The vaccine is not recommended for use in pregnant females. However, pregnancy testing is not needed before receiving a dose.  Measles, mumps, rubella (MMR) vaccine.** / You need at least 1 dose of MMR if you were born in 1957 or later. You may also need a 2nd dose. For females of childbearing age, rubella immunity should be determined. If there is no evidence of immunity, females who are not pregnant should be vaccinated. If there is no evidence of immunity, females who are pregnant should delay immunization until after pregnancy.  Pneumococcal 13-valent conjugate (PCV13) vaccine.** / Consult your health care provider.  Pneumococcal polysaccharide (PPSV23) vaccine.** / 1 to 2 doses if you smoke cigarettes or if you have certain conditions.  Meningococcal vaccine.** /  1 dose if you are age 19 to 21 years and a first-year college  student living in a residence hall, or have one of several medical conditions, you need to get vaccinated against meningococcal disease. You may also need additional booster doses.  Hepatitis A vaccine.** / Consult your health care provider.  Hepatitis B vaccine.** / Consult your health care provider.  Haemophilus influenzae type b (Hib) vaccine.** / Consult your health care provider. Ages 40 to 64 years  Blood pressure check.** / Every 1 to 2 years.  Lipid and cholesterol check.** / Every 5 years beginning at age 20 years.  Lung cancer screening. / Every year if you are aged 55-80 years and have a 30-pack-year history of smoking and currently smoke or have quit within the past 15 years. Yearly screening is stopped once you have quit smoking for at least 15 years or develop a health problem that would prevent you from having lung cancer treatment.  Clinical breast exam.** / Every year after age 40 years.  BRCA-related cancer risk assessment.** / For women who have family members with a BRCA-related cancer (breast, ovarian, tubal, or peritoneal cancers).  Mammogram.** / Every year beginning at age 40 years and continuing for as long as you are in good health. Consult with your health care provider.  Pap test.** / Every 3 years starting at age 30 years through age 65 or 70 years with a history of 3 consecutive normal Pap tests.  HPV screening.** / Every 3 years from ages 30 years through ages 65 to 70 years with a history of 3 consecutive normal Pap tests.  Fecal occult blood test (FOBT) of stool. / Every year beginning at age 50 years and continuing until age 75 years. You may not need to do this test if you get a colonoscopy every 10 years.  Flexible sigmoidoscopy or colonoscopy.** / Every 5 years for a flexible sigmoidoscopy or every 10 years for a colonoscopy beginning at age 50 years and continuing until age 75 years.  Hepatitis C blood test.** / For all people born from 1945 through  1965 and any individual with known risks for hepatitis C.  Skin self-exam. / Monthly.  Influenza vaccine. / Every year.  Tetanus, diphtheria, and acellular pertussis (Tdap/Td) vaccine.** / Consult your health care provider. Pregnant women should receive 1 dose of Tdap vaccine during each pregnancy. 1 dose of Td every 10 years.  Varicella vaccine.** / Consult your health care provider. Pregnant females who do not have evidence of immunity should receive the first dose after pregnancy.  Zoster vaccine.** / 1 dose for adults aged 60 years or older.  Measles, mumps, rubella (MMR) vaccine.** / You need at least 1 dose of MMR if you were born in 1957 or later. You may also need a 2nd dose. For females of childbearing age, rubella immunity should be determined. If there is no evidence of immunity, females who are not pregnant should be vaccinated. If there is no evidence of immunity, females who are pregnant should delay immunization until after pregnancy.  Pneumococcal 13-valent conjugate (PCV13) vaccine.** / Consult your health care provider.  Pneumococcal polysaccharide (PPSV23) vaccine.** / 1 to 2 doses if you smoke cigarettes or if you have certain conditions.  Meningococcal vaccine.** / Consult your health care provider.  Hepatitis A vaccine.** / Consult your health care provider.  Hepatitis B vaccine.** / Consult your health care provider.  Haemophilus influenzae type b (Hib) vaccine.** / Consult your health care provider. Ages 65   years and over  Blood pressure check.** / Every 1 to 2 years.  Lipid and cholesterol check.** / Every 5 years beginning at age 22 years.  Lung cancer screening. / Every year if you are aged 73-80 years and have a 30-pack-year history of smoking and currently smoke or have quit within the past 15 years. Yearly screening is stopped once you have quit smoking for at least 15 years or develop a health problem that would prevent you from having lung cancer  treatment.  Clinical breast exam.** / Every year after age 4 years.  BRCA-related cancer risk assessment.** / For women who have family members with a BRCA-related cancer (breast, ovarian, tubal, or peritoneal cancers).  Mammogram.** / Every year beginning at age 40 years and continuing for as long as you are in good health. Consult with your health care provider.  Pap test.** / Every 3 years starting at age 9 years through age 34 or 91 years with 3 consecutive normal Pap tests. Testing can be stopped between 65 and 70 years with 3 consecutive normal Pap tests and no abnormal Pap or HPV tests in the past 10 years.  HPV screening.** / Every 3 years from ages 57 years through ages 64 or 45 years with a history of 3 consecutive normal Pap tests. Testing can be stopped between 65 and 70 years with 3 consecutive normal Pap tests and no abnormal Pap or HPV tests in the past 10 years.  Fecal occult blood test (FOBT) of stool. / Every year beginning at age 15 years and continuing until age 17 years. You may not need to do this test if you get a colonoscopy every 10 years.  Flexible sigmoidoscopy or colonoscopy.** / Every 5 years for a flexible sigmoidoscopy or every 10 years for a colonoscopy beginning at age 86 years and continuing until age 71 years.  Hepatitis C blood test.** / For all people born from [birth year] through 1966 and any individual with known risks for hepatitis C.  Osteoporosis screening.** / A one-time screening for women ages 83 years and over and women at risk for fractures or osteoporosis.  Skin self-exam. / Monthly.  Influenza vaccine. / Every year.  Tetanus, diphtheria, and acellular pertussis (Tdap/Td) vaccine.** / 1 dose of Td every 10 years.  Varicella vaccine.** / Consult your health care provider.  Zoster vaccine.** / 1 dose for adults aged 61 years or older.  Pneumococcal 13-valent conjugate (PCV13) vaccine.** / Consult your health care provider.  Pneumococcal  polysaccharide (PPSV23) vaccine.** / 1 dose for all adults aged 28 years and older.  Meningococcal vaccine.** / Consult your health care provider.  Hepatitis A vaccine.** / Consult your health care provider.  Hepatitis B vaccine.** / Consult your health care provider.  Haemophilus influenzae type b (Hib) vaccine.** / Consult your health care provider. ** Family history and personal history of risk and conditions may change your health care provider's recommendations. Document Released: 05/23/2001 Document Revised: 08/11/2013 Document Reviewed: 08/22/2010 [facility] Patient Information 2015 [place], [place]. This information is not intended to replace advice given to you by your health care provider. Make sure you discuss any questions you have with your health care provider.

## 2014-10-09 LAB — CYTOLOGY - PAP

## 2015-02-08 ENCOUNTER — Ambulatory Visit (INDEPENDENT_AMBULATORY_CARE_PROVIDER_SITE_OTHER): Payer: BC Managed Care – PPO | Admitting: Family Medicine

## 2015-02-08 ENCOUNTER — Encounter: Payer: Self-pay | Admitting: Family Medicine

## 2015-02-08 ENCOUNTER — Other Ambulatory Visit: Payer: Self-pay

## 2015-02-08 VITALS — BP 106/72 | HR 60 | Temp 98.4°F | Resp 16 | Wt 244.6 lb

## 2015-02-08 DIAGNOSIS — R059 Cough, unspecified: Secondary | ICD-10-CM

## 2015-02-08 DIAGNOSIS — R05 Cough: Secondary | ICD-10-CM | POA: Diagnosis not present

## 2015-02-08 DIAGNOSIS — J309 Allergic rhinitis, unspecified: Secondary | ICD-10-CM | POA: Diagnosis not present

## 2015-02-08 DIAGNOSIS — K30 Functional dyspepsia: Secondary | ICD-10-CM | POA: Insufficient documentation

## 2015-02-08 MED ORDER — BENZONATATE 200 MG PO CAPS: 200.0000 mg | ORAL_CAPSULE | Freq: Three times a day (TID) | ORAL | Status: DC | PRN

## 2015-02-08 MED ORDER — FLUTICASONE PROPIONATE 50 MCG/ACT NA SUSP: 2.0000 | Freq: Every day | NASAL | Status: DC

## 2015-02-08 NOTE — Progress Notes (Addendum)
Patient ID: Patricia Compton, female   DOB: [birth year], 34 y.o.   MRN: [id number] Name: Patricia Compton   MRN: [id number]    DOB: [birth year]   Date:02/08/2015       Progress Note  Subjective  Chief Complaint  Chief Complaint  Patient presents with  . Cough    Cough This is a new problem. The current episode started more than 1 month ago. The problem has been gradually worsening. The cough is productive of sputum (clear color now). Associated symptoms include nasal congestion, postnasal drip and rhinorrhea. Pertinent negatives include no chills, fever, heartburn, hemoptysis, sore throat or wheezing. Associated symptoms comments: Clear nasal drainage with ticklish cough.. The symptoms are aggravated by lying down and exercise. She has tried OTC cough suppressant (Robitussin, Dayquil and Mucinex) for the symptoms. The treatment provided no relief.   Patient Active Problem List   Diagnosis Date Noted  . Acid indigestion 02/08/2015  . Obesity 07/18/2011  . CN (constipation) 03/22/2006   Past Surgical History  Procedure Laterality Date  . Cesarean section    . Wisdom tooth extraction    . Cholecystectomy    . Tubal ligation Bilateral 07/16/2012    Procedure: BILATERAL TUBAL LIGATION;  Surgeon: Catalina Antigua, MD;  Location: [facility];  Service: Obstetrics;  Laterality: Bilateral;  . Cesarean section N/A 07/16/2012    Procedure: CESAREAN SECTION;  Surgeon: Catalina Antigua, MD;  Location: [facility];  Service: Obstetrics;  Laterality: N/A;   Family History  Problem Relation Age of Onset  . Hypertension Mother   . Cancer Mother     breast  . COPD Father   . Cancer Maternal Grandmother    Past Medical History  Diagnosis Date  . Morbid obesity (HCC)   . GERD (gastroesophageal reflux disease)    Social History  Substance Use Topics  . Smoking status: Never Smoker   . Smokeless tobacco: Not on file  . Alcohol Use: No   No current outpatient prescriptions on file.  No Known  Allergies  Review of Systems  Constitutional: Negative.  Negative for fever and chills.  HENT: Positive for postnasal drip and rhinorrhea. Negative for sore throat.   Eyes: Negative.   Respiratory: Positive for cough. Negative for hemoptysis and wheezing.   Cardiovascular: Negative.   Gastrointestinal: Negative.  Negative for heartburn.  Genitourinary: Negative.   Musculoskeletal: Negative.   Skin: Negative.   Neurological: Negative.   Endo/Heme/Allergies: Negative.   Psychiatric/Behavioral: Negative.    Objective  Filed Vitals:   02/08/15 1051  BP: 106/72  Pulse: 60  Temp: 98.4 F (36.9 C)  TempSrc: Oral  Resp: 16  Weight: 244 lb 9.6 oz (110.95 kg)  SpO2: 100%  LMP 01-22-15 with history of BTL.   Physical Exam  Constitutional: She is oriented to person, place, and time and well-developed, well-nourished, and in no distress.  HENT:  Head: Normocephalic.  Right Ear: External ear normal.  Left Ear: External ear normal.  Nose: Nose normal.  Mouth/Throat: Oropharynx is clear and moist. No oropharyngeal exudate.  Cobblestone appearance of posterior pharynx with clear nasal drainage with slightly reddened turbinates.  Eyes: Conjunctivae and EOM are normal.  Neck: Normal range of motion.  Cardiovascular: Normal rate and regular rhythm.   Pulmonary/Chest: Effort normal and breath sounds normal. She has no wheezes. She has no rales.  Abdominal: Soft.  Musculoskeletal: Normal range of motion.  Neurological: She is alert and oriented to person, place, and time.  Psychiatric: Memory, affect and judgment  normal.    Assessment & Plan  1. Cough Onset after a "cold". Suspect allergic rhinitis with post nasal drip. No fever or purulent sputum now. Denies dyspepsia or reflux symptoms recently. Will give Tessalon Perles to suppress cough. Encouraged to drink extra fluids and recheck if no better in 4-5 days. - benzonatate (TESSALON) 200 MG capsule; Take 1 capsule (200 mg total) by  mouth 3 (three) times daily as needed for cough.  Dispense: 30 capsule; Refill: 0  2. Allergic rhinitis, unspecified allergic rhinitis type Clear rhinorrhea with ticklish cough. Recommend Allegra or Zyrtec and add Flonase nasal spray. Recheck prn. - fluticasone (FLONASE) 50 MCG/ACT nasal spray; Place 2 sprays into both nostrils daily.  Dispense: 16 g; Refill: 3

## 2015-02-08 NOTE — Patient Instructions (Signed)

## 2015-10-11 ENCOUNTER — Ambulatory Visit (INDEPENDENT_AMBULATORY_CARE_PROVIDER_SITE_OTHER): Payer: BC Managed Care – PPO | Admitting: Family Medicine

## 2015-10-11 ENCOUNTER — Encounter: Payer: Self-pay | Admitting: Family Medicine

## 2015-10-11 ENCOUNTER — Ambulatory Visit
Admission: RE | Admit: 2015-10-11 | Discharge: 2015-10-11 | Disposition: A | Discharge status: Home / Self Care | Payer: BC Managed Care – PPO | Source: Ambulatory Visit | Attending: Family Medicine | Admitting: Family Medicine

## 2015-10-11 VITALS — BP 122/78 | HR 112 | Temp 99.3°F | Resp 20 | Wt 245.0 lb

## 2015-10-11 DIAGNOSIS — R05 Cough: Secondary | ICD-10-CM | POA: Insufficient documentation

## 2015-10-11 DIAGNOSIS — R509 Fever, unspecified: Secondary | ICD-10-CM

## 2015-10-11 DIAGNOSIS — R5383 Other fatigue: Secondary | ICD-10-CM | POA: Diagnosis not present

## 2015-10-11 DIAGNOSIS — R918 Other nonspecific abnormal finding of lung field: Secondary | ICD-10-CM | POA: Insufficient documentation

## 2015-10-11 DIAGNOSIS — R059 Cough, unspecified: Secondary | ICD-10-CM

## 2015-10-11 DIAGNOSIS — R5381 Other malaise: Secondary | ICD-10-CM | POA: Diagnosis not present

## 2015-10-11 LAB — POCT INFLUENZA A/B
Influenza A, POC: NEGATIVE
Influenza B, POC: NEGATIVE

## 2015-10-11 IMAGING — CR DG CHEST 2V
1 series · 2 of 2 positions shown · non-contrast
Comparison: None.

CLINICAL DATA: Cough for 3 days

EXAM:
CHEST  2 VIEW

[Series 1: dg chest 2 view · 0.14mm/px · 2 of 2 slices shown]
[im 1/2]
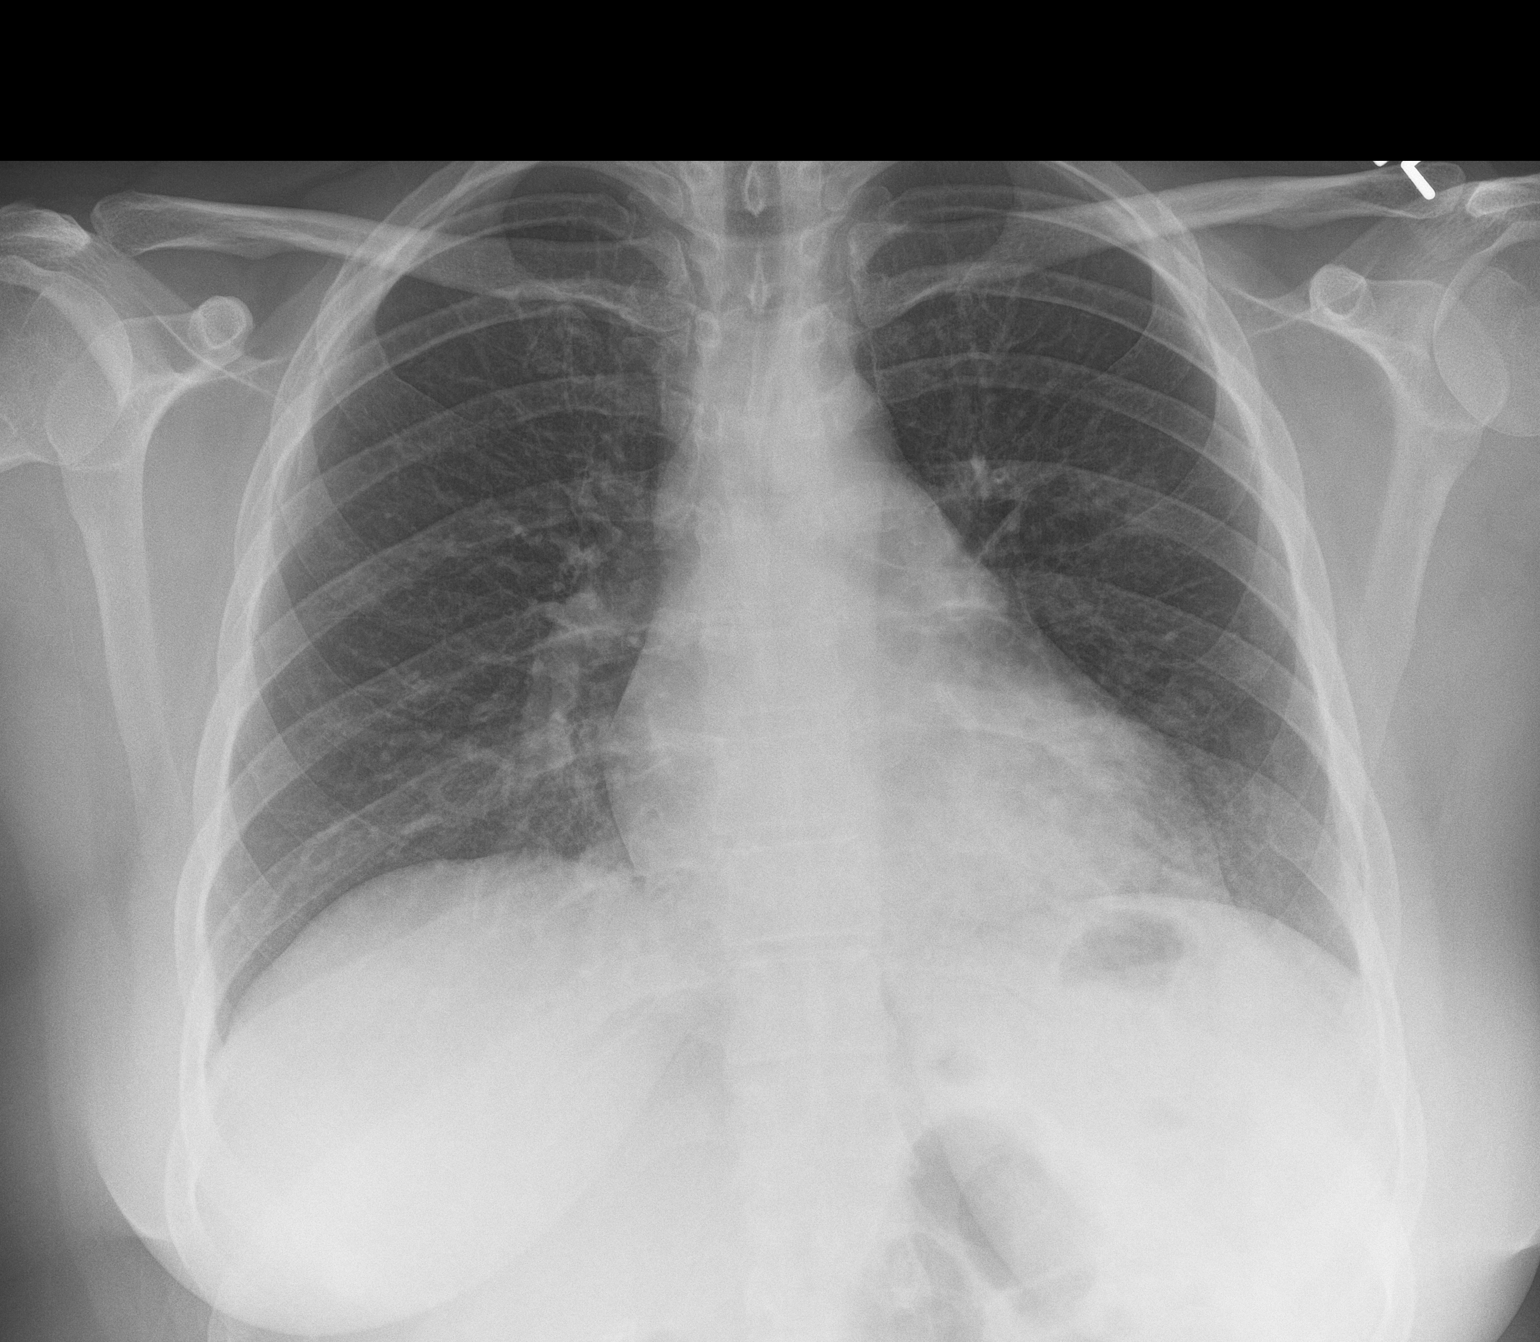
[im 2/2]
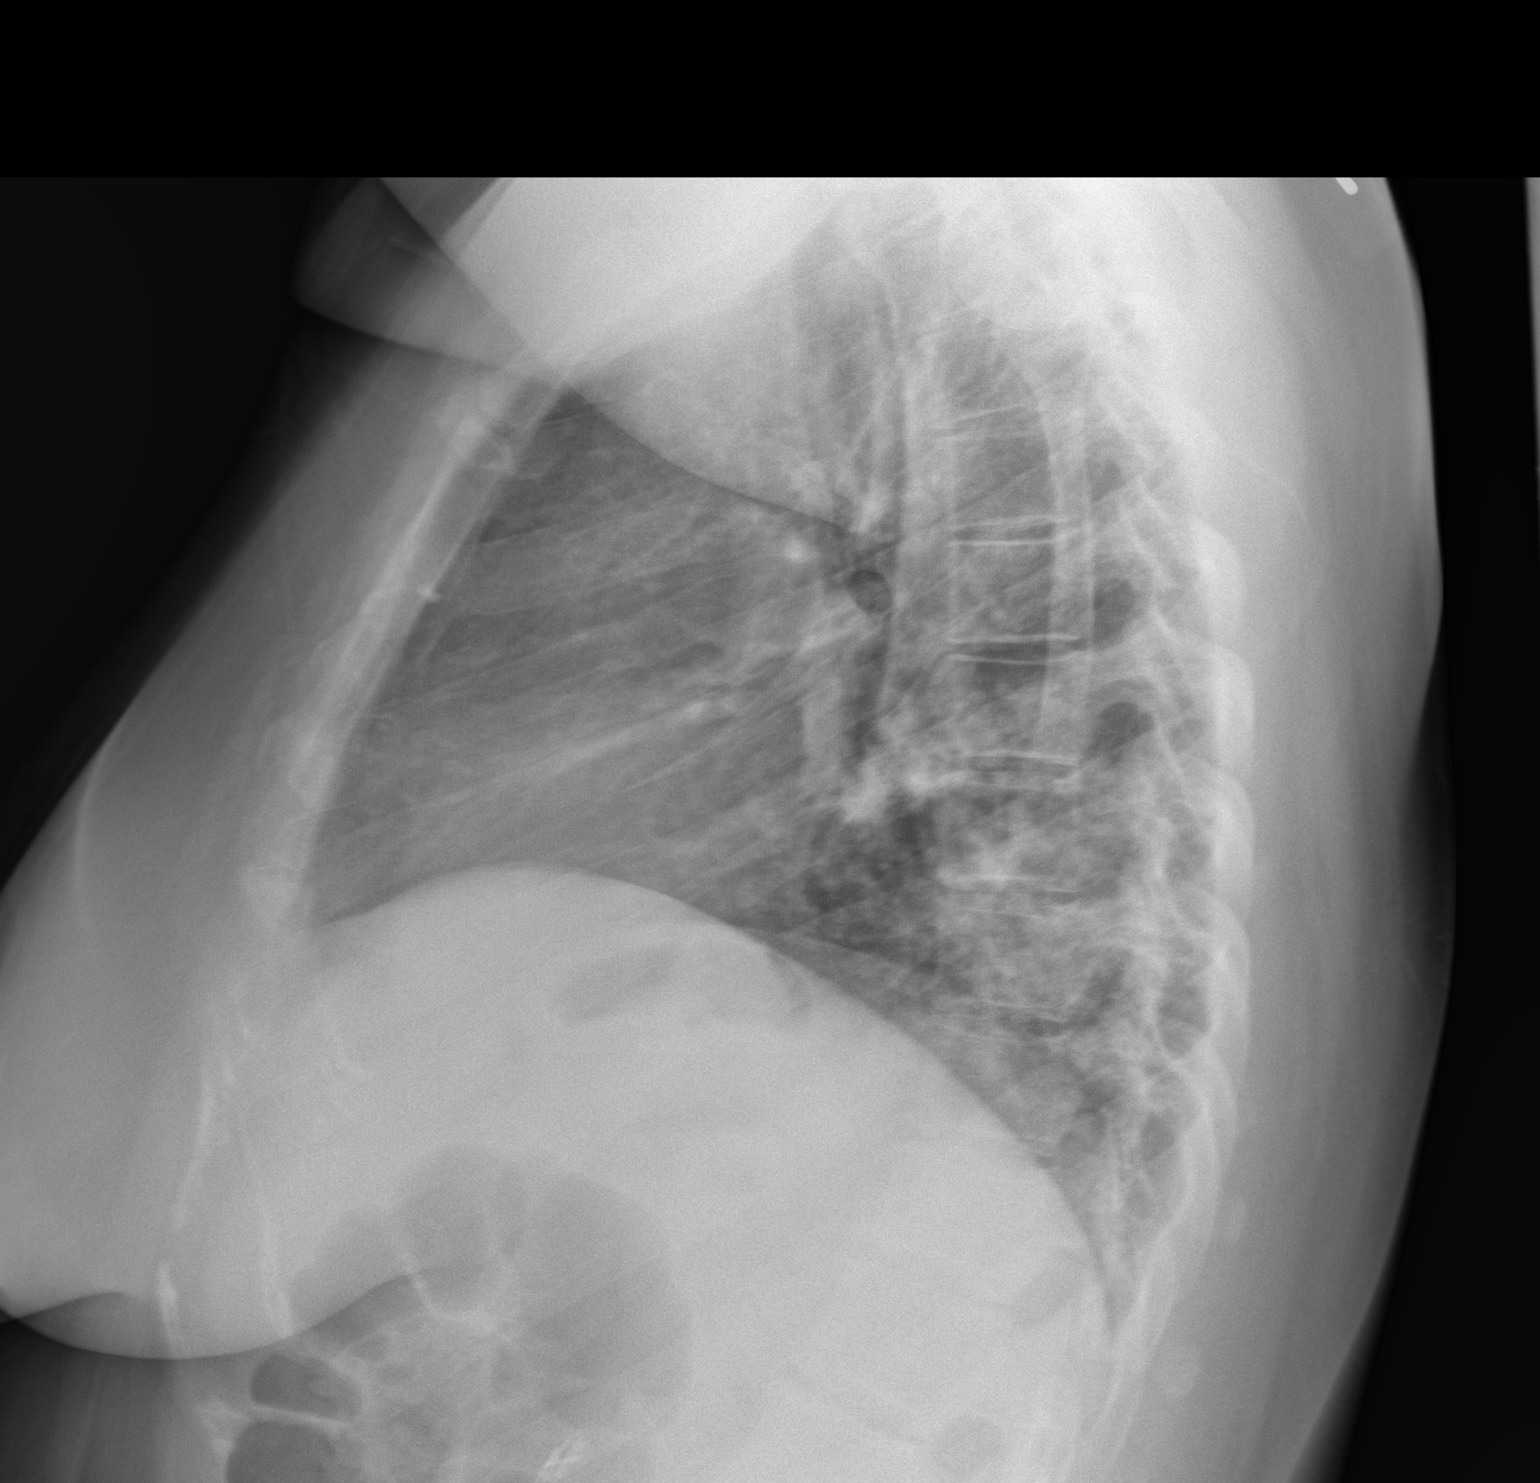

[2 of 2 positions shown; findings below may reference images not displayed]

FINDINGS: There is airspace consolidation in the left lower lobe, primarily in
the posterior segment. Minimal increased opacity is noted in the
left upper lobe. Lungs elsewhere clear. Heart size and pulmonary
vascularity are normal. No adenopathy. No bone lesions.
IMPRESSION: Left lower lobe airspace consolidation consistent with pneumonia.
Suspect second focus of infiltrate in the left upper lobe, rather
minimal currently. Lungs elsewhere clear. Cardiac silhouette within
normal limits.

## 2015-10-11 MED ORDER — ALBUTEROL SULFATE HFA 108 (90 BASE) MCG/ACT IN AERS
2.0000 | INHALATION_SPRAY | Freq: Four times a day (QID) | RESPIRATORY_TRACT | Status: DC | PRN
Start: 2015-10-11 — End: 2016-10-10

## 2015-10-11 MED ORDER — AZITHROMYCIN 250 MG PO TABS: ORAL_TABLET | ORAL | Status: DC

## 2015-10-11 MED ORDER — HYDROCODONE-HOMATROPINE 5-1.5 MG/5ML PO SYRP: 5.0000 mL | ORAL_SOLUTION | Freq: Three times a day (TID) | ORAL | Status: DC | PRN

## 2015-10-11 NOTE — Progress Notes (Signed)
Patient: Patricia Compton Female    DOB: Oct 24, 1980   34 y.o.   MRN: [id number] Visit Date: 10/11/2015  Today's Provider: Dortha Kern, PA   Chief Complaint  Patient presents with  . Cough    X 3 days.    Subjective:    Cough This is a new problem. The current episode started in the past 7 days. The problem has been gradually worsening. The cough is non-productive. Associated symptoms include chills, a fever, headaches, shortness of breath and sweats.  Patient reports that she has a painful cough. She reports that she also feels short of breath. She has a pressure sensation in her chest. Denies any history of asthma. She does mention that initially she had fever and chills with night sweats. Patient has been taking OTC cough medication with no relief.   Patient Active Problem List   Diagnosis Date Noted  . Acid indigestion 02/08/2015  . Obesity 07/18/2011  . CN (constipation) 03/22/2006   Past Surgical History  Procedure Laterality Date  . Cesarean section    . Wisdom tooth extraction    . Cholecystectomy    . Tubal ligation Bilateral 07/16/2012    Procedure: BILATERAL TUBAL LIGATION;  Surgeon: Catalina Antigua, MD;  Location: [facility];  Service: Obstetrics;  Laterality: Bilateral;  . Cesarean section N/A 07/16/2012    Procedure: CESAREAN SECTION;  Surgeon: Catalina Antigua, MD;  Location: [facility];  Service: Obstetrics;  Laterality: N/A;   Family History  Problem Relation Age of Onset  . Hypertension Mother   . Cancer Mother     breast  . COPD Father   . Cancer Maternal Grandmother    No Known Allergies   No current outpatient prescriptions on file prior to visit.   No current facility-administered medications on file prior to visit.   Review of Systems  Constitutional: Positive for fever and chills.  Respiratory: Positive for cough and shortness of breath.   Neurological: Positive for headaches.   Social History  Substance Use Topics  . Smoking status: Never  Smoker   . Smokeless tobacco: Not on file  . Alcohol Use: No   Objective:   BP 122/78 mmHg  Pulse 112  Temp(Src) 99.3 F (37.4 C)  Resp 20  Wt 245 lb (111.131 kg)  SpO2 99%  Physical Exam  Constitutional: She is oriented to person, place, and time. She appears well-developed and well-nourished. No distress.  HENT:  Head: Normocephalic and atraumatic.  Right Ear: Hearing and external ear normal.  Left Ear: Hearing and external ear normal.  Nose: Nose normal.  Slightly reddened and cobblestone appearance to posterior pharynx. No exudates.  Eyes: Conjunctivae and lids are normal. Right eye exhibits no discharge. Left eye exhibits no discharge. No scleral icterus.  Neck: Neck supple.  Cardiovascular: Normal rate and regular rhythm.   Pulmonary/Chest: Effort normal. No respiratory distress.  Slightly coarse breath sounds.  Abdominal: Soft. Bowel sounds are normal.  Musculoskeletal: Normal range of motion.  Lymphadenopathy:    She has no cervical adenopathy.  Neurological: She is alert and oriented to person, place, and time.  Skin: Skin is intact. No lesion and no rash noted.  Psychiatric: She has a normal mood and affect. Her speech is normal and behavior is normal. Thought content normal.      Assessment & Plan:     1. Fever and chills Had fever Thursday, Friday and Saturday with cough and slight shortness of breath (peak fever was  102.8 - brought down to 99 with Tylenol and/or Advil). Will check CBC and CXR. Flu test negative for influenza A&B. Increase fluid intake and use antipyretic of choice if needed. Given Z-pak and cough syrup. Home to rest and recheck pending reports. - POCT Influenza A/B - CBC with Differential/Platelet - DG Chest 2 View - azithromycin (ZITHROMAX) 250 MG tablet; Take 2 tablets by mouth today then one daily for 4 days.  Dispense: 6 tablet; Refill: 0  2. Cough Onset over the past 7 days. No help with OTC cough medications. With fever as above, will  check labs and CXR to rule out pneumonia. Given Hycodan to suppress cough and allow her to rest. Increase fluid intake and recheck pending reports. - POCT Influenza A/B - CBC with Differential/Platelet - DG Chest 2 View - azithromycin (ZITHROMAX) 250 MG tablet; Take 2 tablets by mouth today then one daily for 4 days.  Dispense: 6 tablet; Refill: 0 - HYDROcodone-homatropine (HYCODAN) 5-1.5 MG/5ML syrup; Take 5 mLs by mouth every 8 (eight) hours as needed for cough.  Dispense: 120 mL; Refill: 0 - albuterol (PROVENTIL HFA;VENTOLIN HFA) 108 (90 Base) MCG/ACT inhaler; Inhale 2 puffs into the lungs every 6 (six) hours as needed for wheezing or shortness of breath.  Dispense: 1 Inhaler; Refill: 2  3. Malaise and fatigue Onset with fever and cough. Negative flu test. Checking CBC with diff. - POCT Influenza A/B       Dortha Kern, PA  [facility]

## 2015-10-12 LAB — CBC WITH DIFFERENTIAL/PLATELET
Basophils Absolute: 0 10*3/uL (ref 0.0–0.2)
Basos: 0 %
EOS (ABSOLUTE): 0.1 10*3/uL (ref 0.0–0.4)
EOS: 1 %
Hematocrit: 38.3 % (ref 34.0–46.6)
Hemoglobin: 13.4 g/dL (ref 11.1–15.9)
Immature Grans (Abs): 0 10*3/uL (ref 0.0–0.1)
Immature Granulocytes: 0 %
LYMPHS ABS: 1 10*3/uL (ref 0.7–3.1)
Lymphs: 13 %
MCH: 30.5 pg (ref 26.6–33.0)
MCHC: 35 g/dL (ref 31.5–35.7)
MCV: 87 fL (ref 79–97)
Monocytes Absolute: 0.6 10*3/uL (ref 0.1–0.9)
Monocytes: 8 %
Neutrophils Absolute: 5.5 10*3/uL (ref 1.4–7.0)
Neutrophils: 78 %
Platelets: 279 10*3/uL (ref 150–379)
RBC: 4.39 x10E6/uL (ref 3.77–5.28)
RDW: 12.8 % (ref 12.3–15.4)
WBC: 7.2 10*3/uL (ref 3.4–10.8)

## 2015-10-14 ENCOUNTER — Telehealth: Payer: Self-pay

## 2015-10-14 NOTE — Telephone Encounter (Signed)
Left message to call back  

## 2015-10-14 NOTE — Telephone Encounter (Signed)
-----   Message from Tamsen Roers, [place] sent at 10/14/2015  9:56 AM EDT ----- Normal blood tests. Finish antibiotics for pneumonia on x-ray. Recheck lungs in 1-2 weeks.

## 2015-10-14 NOTE — Telephone Encounter (Signed)
Advised patient as below.  

## 2015-11-30 ENCOUNTER — Ambulatory Visit
Admission: EM | Admit: 2015-11-30 | Discharge: 2015-11-30 | Disposition: A | Discharge status: Home / Self Care | Payer: BC Managed Care – PPO | Attending: Family Medicine | Admitting: Family Medicine

## 2015-11-30 DIAGNOSIS — J020 Streptococcal pharyngitis: Secondary | ICD-10-CM

## 2015-11-30 LAB — RAPID STREP SCREEN (MED CTR MEBANE ONLY): STREPTOCOCCUS, GROUP A SCREEN (DIRECT): POSITIVE — AB

## 2015-11-30 MED ORDER — PENICILLIN G BENZATHINE 1200000 UNIT/2ML IM SUSP
1.2000 10*6.[IU] | Freq: Once | INTRAMUSCULAR | Status: AC
  Administered 2015-11-30: 1.2 10*6.[IU] via INTRAMUSCULAR

## 2015-11-30 NOTE — ED Triage Notes (Signed)
Patient states that she has a sore throat that started yesterday. 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 Patient states that she has been having painful swallowing and pain that has kept her up at night.

## 2015-11-30 NOTE — ED Provider Notes (Signed)
[facility]    CSN: [id number] Arrival date & time: 11/30/15  0820  First Provider Contact:  First MD Initiated Contact with Patient 11/30/15 [phone]        History   Chief Complaint Chief Complaint  Patient presents with  . Sore Throat    HPI Patricia Compton is a 35 y.o. female.   Patient is here because of sore throat. She states that this started yesterday she started swallowing and increased pain and discomfort when swallowing. 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 States though Worse as night progressed. Everything started from yesterday. She does not smoke. No known medical problems. Previous surgeries 2 C-sections and gallbladder surgery. No pertinent family medical history pertaining to today's visit.   The history is provided by the patient. No language interpreter was used.  Sore Throat  This is a new problem. The current episode started 12 to 24 hours ago. The problem occurs constantly. The problem has been gradually worsening. Pertinent negatives include no chest pain, no abdominal pain, no headaches and no shortness of breath. The symptoms are aggravated by swallowing. Nothing relieves the symptoms. She has tried nothing for the symptoms. The treatment provided no relief.    Past Medical History:  Diagnosis Date  . GERD (gastroesophageal reflux disease)   . Morbid obesity [facility])     Patient Active Problem List   Diagnosis Date Noted  . Acid indigestion 02/08/2015  . Obesity 07/18/2011  . CN (constipation) 03/22/2006    Past Surgical History:  Procedure Laterality Date  . CESAREAN SECTION    . CESAREAN SECTION N/A 07/16/2012   Procedure: CESAREAN SECTION;  Surgeon: Catalina Antigua, MD;  Location: [facility];  Service: Obstetrics;  Laterality: N/A;  . CHOLECYSTECTOMY    . TUBAL LIGATION Bilateral 07/16/2012   Procedure: BILATERAL TUBAL LIGATION;  Surgeon: Catalina Antigua, MD;  Location: [facility];  Service:  Obstetrics;  Laterality: Bilateral;  . WISDOM TOOTH EXTRACTION      OB History    Gravida Para Term Preterm AB Living   2 2 2     2    SAB TAB Ectopic Multiple Live Births           2       Home Medications    Prior to Admission medications   Medication Sig Start Date End Date Taking? Authorizing Provider  albuterol (PROVENTIL HFA;VENTOLIN HFA) 108 (90 Base) MCG/ACT inhaler Inhale 2 puffs into the lungs every 6 (six) hours as needed for wheezing or shortness of breath. 10/11/15   Jodell Cipro Chrismon, PA  azithromycin (ZITHROMAX) 250 MG tablet Take 2 tablets by mouth today then one daily for 4 days. 10/11/15   Jodell Cipro Chrismon, PA  HYDROcodone-homatropine (HYCODAN) 5-1.5 MG/5ML syrup Take 5 mLs by mouth every 8 (eight) hours as needed for cough. 10/11/15   Tamsen Roers, PA    Family History Family History  Problem Relation Age of Onset  . Hypertension Mother   . Cancer Mother     breast  . COPD Father   . Cancer Maternal Grandmother     Social History Social History  Substance Use Topics  . Smoking status: Never Smoker  . Smokeless tobacco: Never Used  . Alcohol use No     Allergies   Review of patient's allergies indicates no known allergies.   Review of Systems Review of Systems  HENT: Positive for sore throat and trouble swallowing. Negative  for mouth sores and rhinorrhea.   Respiratory: Negative for shortness of breath.   Cardiovascular: Negative for chest pain.  Gastrointestinal: Negative for abdominal pain.  Neurological: Negative for headaches.  All other systems reviewed and are negative.    Physical Exam Triage Vital Signs ED Triage Vitals  Enc Vitals Group     BP 11/30/15 0834 115/66     Pulse Rate 11/30/15 0834 79     Resp 11/30/15 0834 16     Temp 11/30/15 0834 97.9 F (36.6 C)     Temp Source 11/30/15 0834 Tympanic     SpO2 11/30/15 0834 100 %     Weight 11/30/15 0832 220 lb (99.8 kg)     Height 11/30/15 0832 5\' 4"  (1.626 m)     Head  Circumference --      Peak Flow --      Pain Score 11/30/15 0833 7     Pain Loc --      Pain Edu? --      Excl. in GC? --    No data found.   Updated Vital Signs BP 115/66 (BP Location: Left Arm)   Pulse 79   Temp 97.9 F (36.6 C) (Tympanic)   Resp 16   Ht 5\' 4"  (1.626 m)   Wt 220 lb (99.8 kg)   LMP 11/25/2015   SpO2 100%   BMI 37.76 kg/m   Visual Acuity Right Eye Distance:   Left Eye Distance:   Bilateral Distance:    Right Eye Near:   Left Eye Near:    Bilateral Near:     Physical Exam  Constitutional: She is oriented to person, place, and time. She appears well-developed and well-nourished. No distress.  HENT:  Head: Normocephalic and atraumatic.  Right Ear: Hearing, tympanic membrane, external ear and ear canal normal.  Left Ear: Hearing, tympanic membrane and external ear normal.  Mouth/Throat: Uvula is midline. No uvula swelling. Posterior oropharyngeal erythema present. No tonsillar abscesses.  Pulmonary/Chest: Effort normal.  Musculoskeletal: Normal range of motion.  Neurological: She is alert and oriented to person, place, and time.  Skin: Skin is warm. She is not diaphoretic.  Psychiatric: She has a normal mood and affect.  Vitals reviewed.    UC Treatments / Results  Labs (all labs ordered are listed, but only abnormal results are displayed) Labs Reviewed  RAPID STREP SCREEN (NOT AT [facility]) - Abnormal; Notable for the following:       Result Value   Streptococcus, Group A Screen (Direct) POSITIVE (*)    All other components within normal limits    EKG  EKG Interpretation None       Radiology No results found.  Procedures Procedures (including critical care time)  Medications Ordered in UC Medications  penicillin g benzathine (BICILLIN LA) 1200000 UNIT/2ML injection 1.2 Million Units (1.2 Million Units Intramuscular Given 11/30/15 0910)     Initial Impression / Assessment and Plan / UC Course  I have reviewed the triage vital signs  and the nursing notes.  Pertinent labs & imaging results that were available during my care of the patient were reviewed by me and considered in my medical decision making (see chart for details).  Clinical Course    Will administer LA Bicillin 1.2 million units IM. Follow-up PCP in 1-2 weeks for prove cure. Will give a work note for today and tomorrow as well.  Final Clinical Impressions(s) / UC Diagnoses   Final diagnoses:  Strep throat  Acute streptococcal pharyngitis  New Prescriptions New Prescriptions   No medications on file     Hassan Rowan, MD 11/30/15 [phone]

## 2016-05-09 ENCOUNTER — Encounter: Payer: Self-pay | Admitting: Family Medicine

## 2016-05-09 ENCOUNTER — Ambulatory Visit: Payer: BC Managed Care – PPO | Admitting: Family Medicine

## 2016-05-09 NOTE — Progress Notes (Deleted)
Patient: Patricia Compton, Female    DOB: 05/08/1980, 36 y.o.   MRN: [id number] Visit Date: 05/09/2016  Today's Provider: Dortha Kern, PA   No chief complaint on file.  Subjective:    Annual physical exam Patricia Compton is a 36 y.o. female who presents today for health maintenance and complete physical. She feels {DESC; WELL/FAIRLY WELL/POORLY:18703}. She reports exercising ***. She reports she is sleeping {DESC; WELL/FAIRLY WELL/POORLY:18703}.  -----------------------------------------------------------------   Review of Systems  Constitutional: Negative.   HENT: Negative.   Eyes: Negative.   Respiratory: Negative.   Cardiovascular: Negative.   Gastrointestinal: Negative.   Endocrine: Negative.   Genitourinary: Negative.   Musculoskeletal: Negative.   Skin: Negative.   Allergic/Immunologic: Negative.   Neurological: Negative.   Hematological: Negative.   Psychiatric/Behavioral: Negative.     Social History      She  reports that she has never smoked. She has never used smokeless tobacco. She reports that she does not drink alcohol or use drugs.       Social History   Social History  . Marital status: Married    Spouse name: N/A  . Number of children: N/A  . Years of education: N/A   Social History Main Topics  . Smoking status: Never Smoker  . Smokeless tobacco: Never Used  . Alcohol use No  . Drug use: No  . Sexual activity: Yes    Partners: Male    Birth control/ protection: Surgical   Other Topics Concern  . None   Social History Narrative  . None    Past Medical History:  Diagnosis Date  . GERD (gastroesophageal reflux disease)   . Morbid obesity [facility])      Patient Active Problem List   Diagnosis Date Noted  . Acid indigestion 02/08/2015  . Obesity 07/18/2011  . CN (constipation) 03/22/2006    Past Surgical History:  Procedure Laterality Date  . CESAREAN SECTION    . CESAREAN SECTION N/A 07/16/2012   Procedure:  CESAREAN SECTION;  Surgeon: Catalina Antigua, MD;  Location: [facility];  Service: Obstetrics;  Laterality: N/A;  . CHOLECYSTECTOMY    . TUBAL LIGATION Bilateral 07/16/2012   Procedure: BILATERAL TUBAL LIGATION;  Surgeon: Catalina Antigua, MD;  Location: [facility];  Service: Obstetrics;  Laterality: Bilateral;  . WISDOM TOOTH EXTRACTION      Family History        Family Status  Relation Status  . Mother Alive  . Father Alive  . Maternal Grandmother Deceased  . Paternal Grandmother Alive        Her family history includes COPD in her father; Cancer in her maternal grandmother and mother; Hypertension in her mother.     No Known Allergies   Current Outpatient Prescriptions:  .  albuterol (PROVENTIL HFA;VENTOLIN HFA) 108 (90 Base) MCG/ACT inhaler, Inhale 2 puffs into the lungs every 6 (six) hours as needed for wheezing or shortness of breath., Disp: 1 Inhaler, Rfl: 2 .  azithromycin (ZITHROMAX) 250 MG tablet, Take 2 tablets by mouth today then one daily for 4 days., Disp: 6 tablet, Rfl: 0 .  HYDROcodone-homatropine (HYCODAN) 5-1.5 MG/5ML syrup, Take 5 mLs by mouth every 8 (eight) hours as needed for cough., Disp: 120 mL, Rfl: 0   Patient Care Team: Tamsen Roers, PA as PCP - General (Family Medicine)      Objective:   Vitals: There were no vitals taken for this visit.   Physical Exam   Depression  Screen No flowsheet data found.    Assessment & Plan:     Routine Health Maintenance and Physical Exam  Exercise Activities and Dietary recommendations Goals    None      Immunization History  Administered Date(s) Administered  . Influenza Split 02/27/2012  . Rho (D) Immune Globulin 03/11/2012, 05/06/2012, 07/17/2012  . Tdap 04/22/2012    Health Maintenance  Topic Date Due  . INFLUENZA VACCINE  11/09/2015  . PAP SMEAR  10/07/2017  . TETANUS/TDAP  04/22/2022  . HIV Screening  Completed     Discussed health benefits of physical activity, and encouraged her to engage in  regular exercise appropriate for her age and condition.    --------------------------------------------------------------------    Dortha Kern, PA  [facility]

## 2016-05-11 ENCOUNTER — Telehealth: Payer: Self-pay | Admitting: Radiology

## 2016-05-11 NOTE — Telephone Encounter (Signed)
Received message from [facility] stating that she wanted to reschedule annual exam appointment between 2/1-2/28 @ 3:15 or later. Called back to reschedule, no answer, left voicemail on cell phone to return call to office.

## 2016-09-20 ENCOUNTER — Other Ambulatory Visit: Payer: Self-pay | Admitting: Family Medicine

## 2016-09-20 DIAGNOSIS — Z1231 Encounter for screening mammogram for malignant neoplasm of breast: Secondary | ICD-10-CM

## 2016-10-02 ENCOUNTER — Ambulatory Visit
Admission: RE | Admit: 2016-10-02 | Discharge: 2016-10-02 | Disposition: A | Discharge status: Home / Self Care | Payer: BC Managed Care – PPO | Source: Ambulatory Visit | Attending: Family Medicine | Admitting: Family Medicine

## 2016-10-02 DIAGNOSIS — Z1231 Encounter for screening mammogram for malignant neoplasm of breast: Secondary | ICD-10-CM | POA: Diagnosis not present

## 2016-10-02 IMAGING — MG MM DIGITAL SCREENING BILAT W/ CAD
4 series · 4 of 4 positions shown · non-contrast
Comparison: None.

CLINICAL DATA: Screening.

EXAM:
DIGITAL SCREENING BILATERAL MAMMOGRAM WITH CAD

[L MLO]
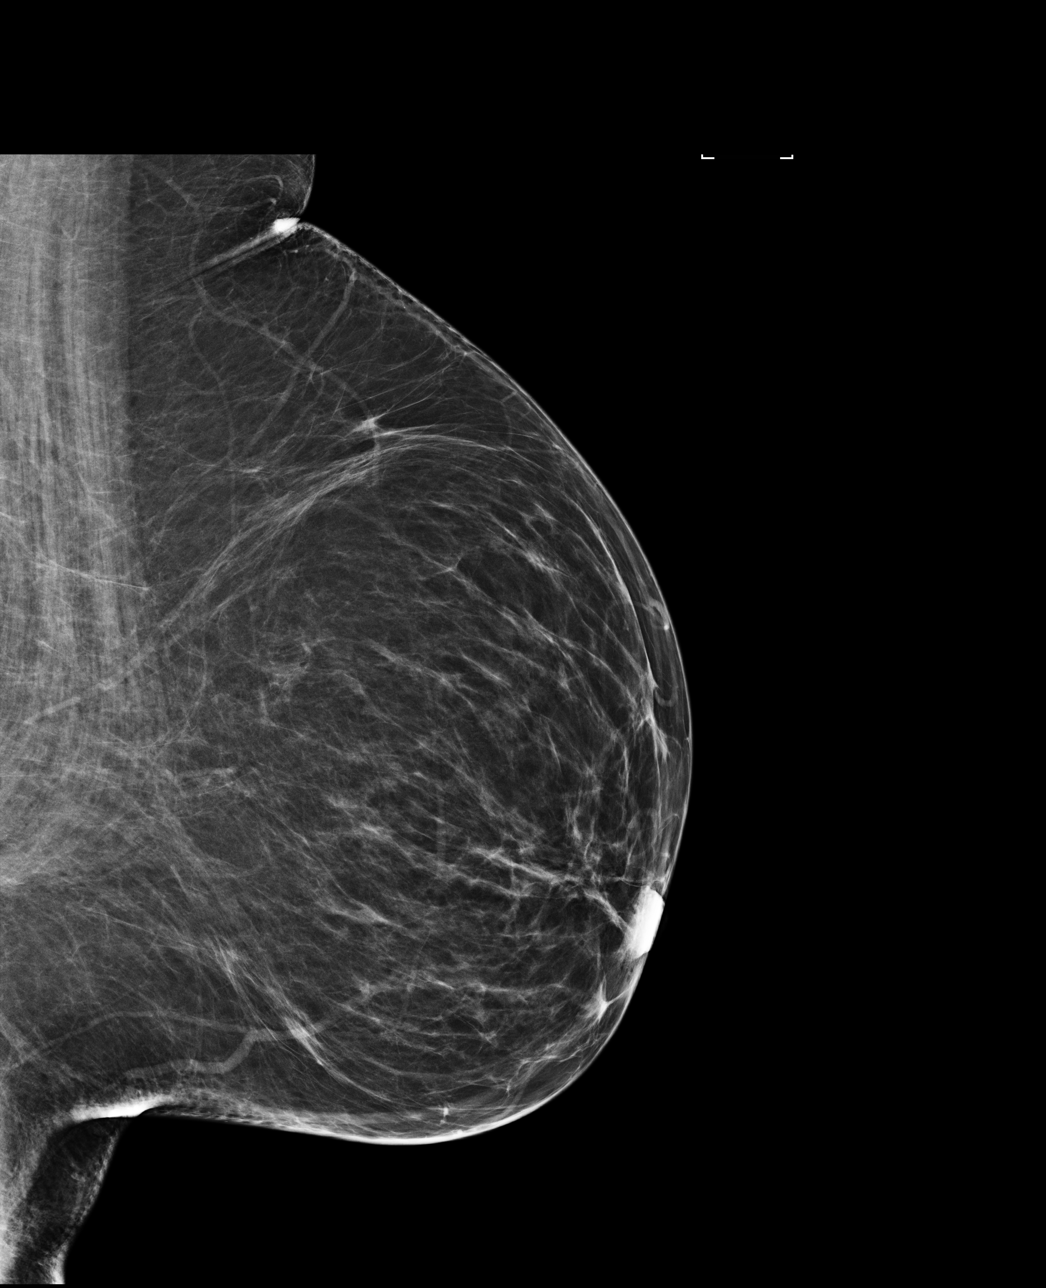

[R MLO]
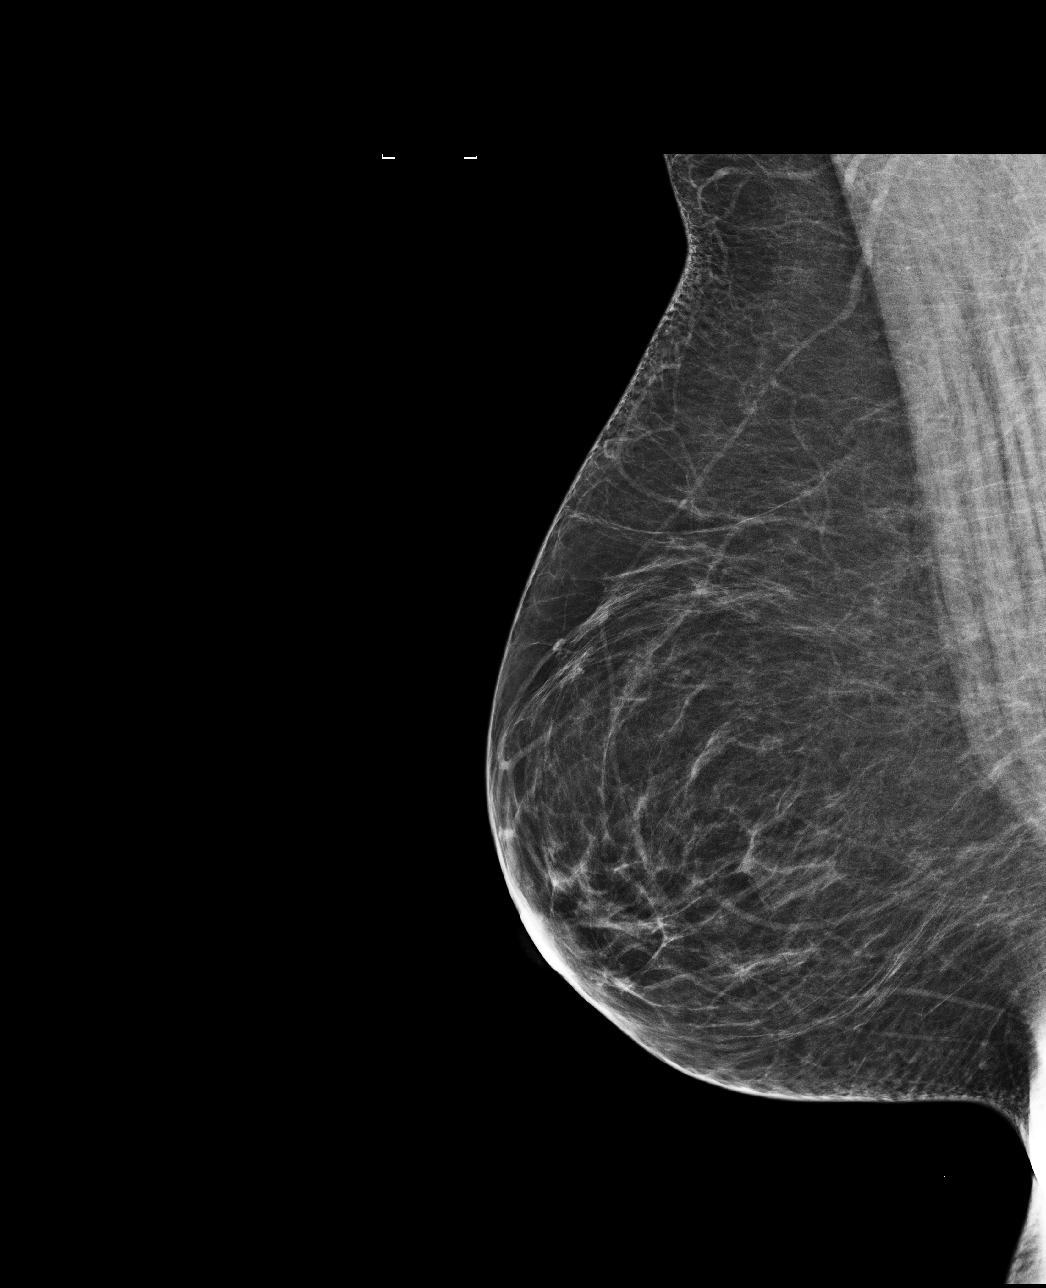

[R CC]
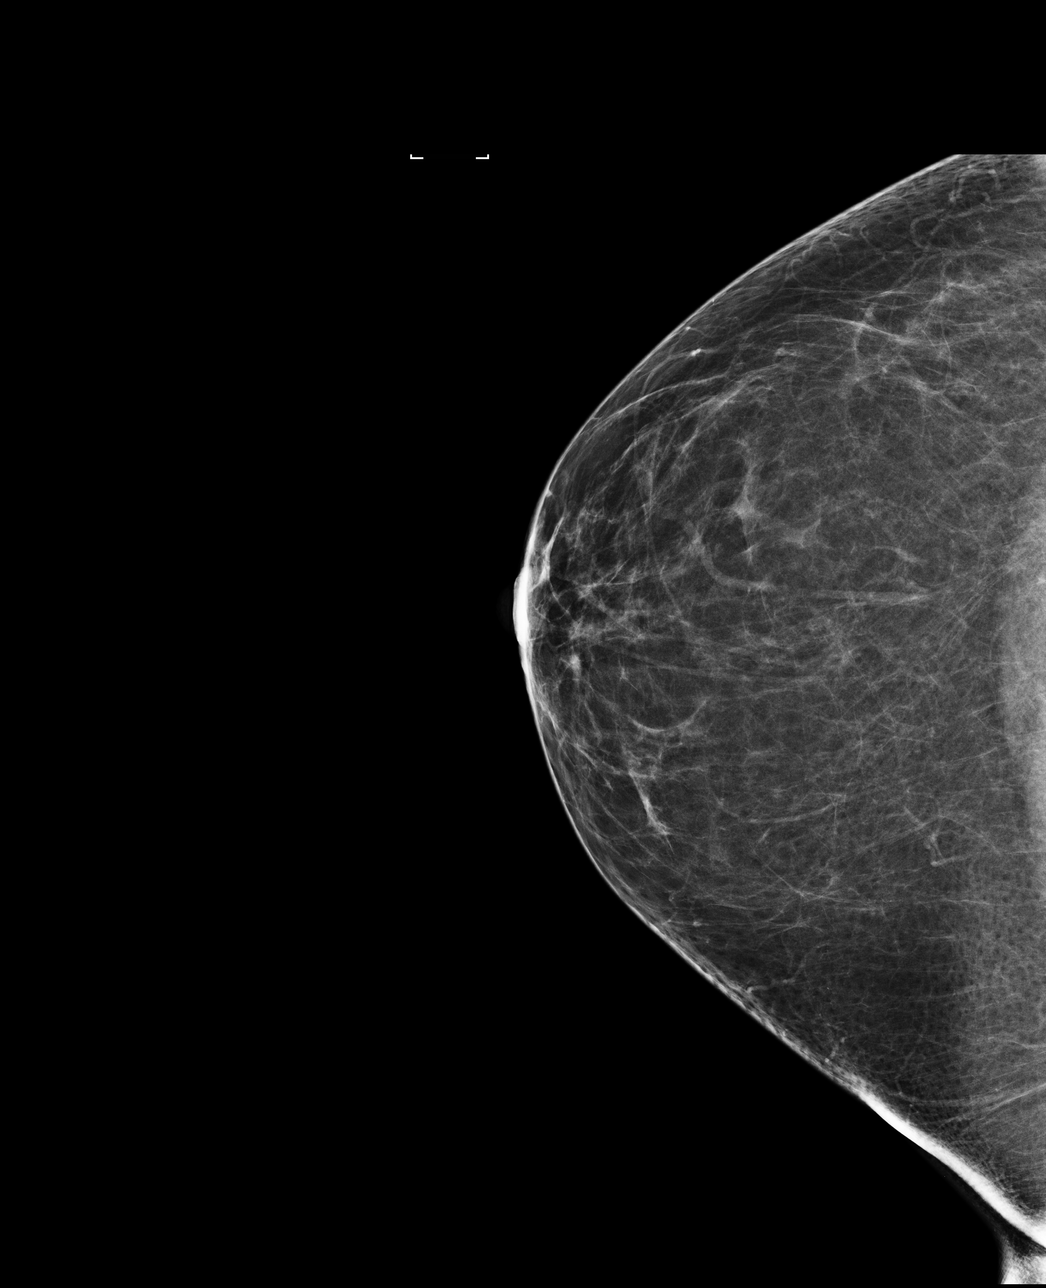

[L CC]
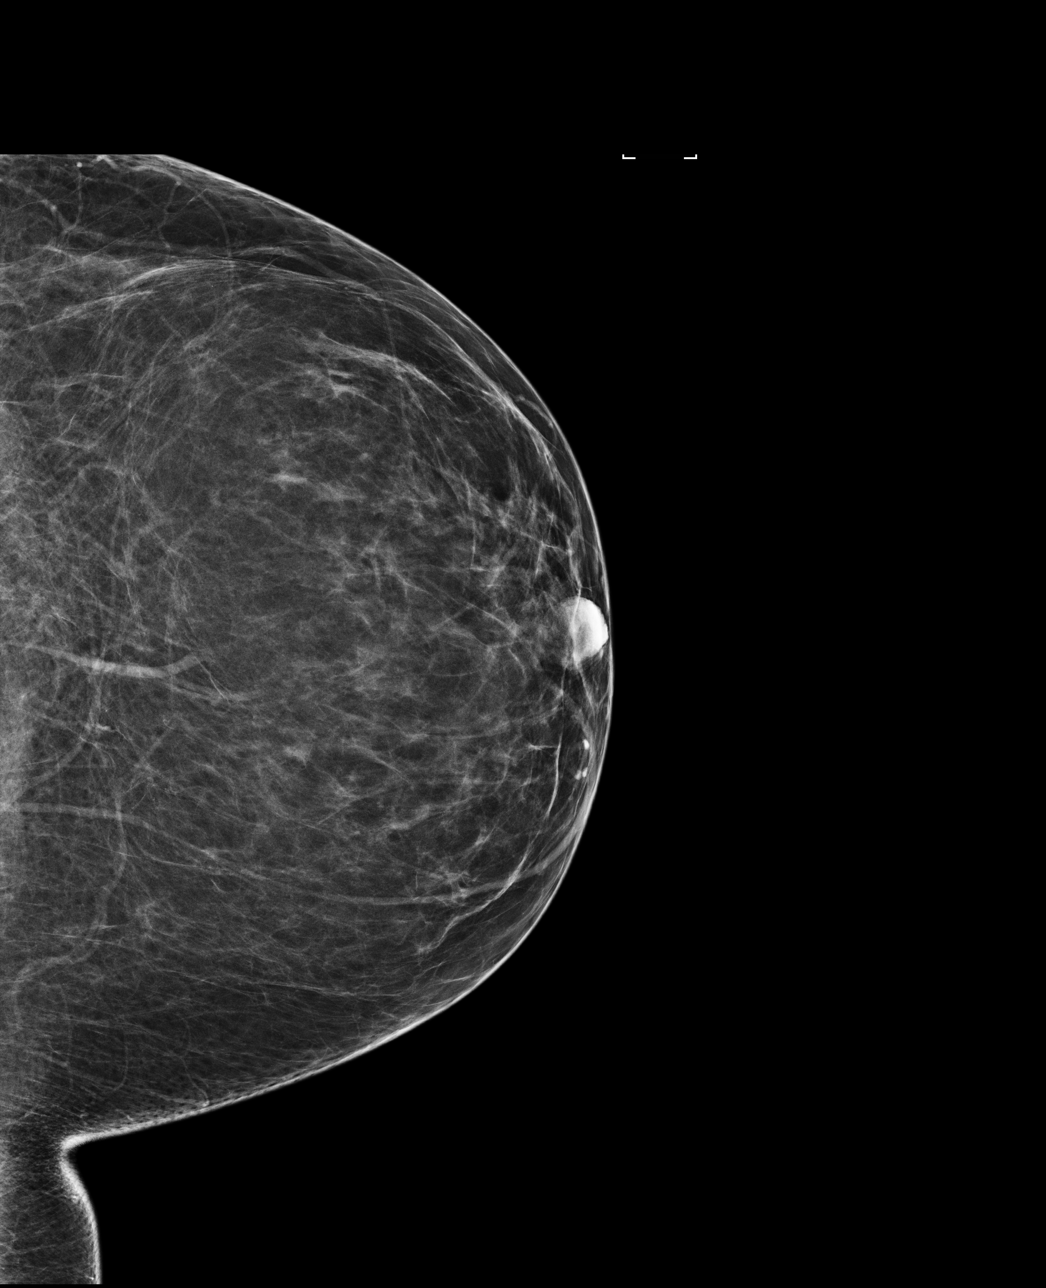

[4 of 4 positions shown; findings below may reference images not displayed]

ACR Breast Density Category b: There are scattered areas of
fibroglandular density.
FINDINGS: There are no findings suspicious for malignancy. Images were
processed with CAD.
IMPRESSION: No mammographic evidence of malignancy. A result letter of this
screening mammogram will be mailed directly to the patient.

RECOMMENDATION:
Screening mammogram at age 39. (Code:[UJ])

BI-RADS CATEGORY  1: Negative.

## 2016-10-03 ENCOUNTER — Telehealth: Payer: Self-pay

## 2016-10-03 NOTE — Telephone Encounter (Signed)
LMTCB

## 2016-10-03 NOTE — Telephone Encounter (Signed)
-----   Message from Tamsen Roers, [place] sent at 10/02/2016  5:35 PM EDT ----- Normal mammograms. Radiologist recommended next screening at age 36.

## 2016-10-05 NOTE — Telephone Encounter (Signed)
LMTCB

## 2016-10-10 ENCOUNTER — Encounter: Payer: Self-pay | Admitting: Physician Assistant

## 2016-10-10 ENCOUNTER — Ambulatory Visit (INDEPENDENT_AMBULATORY_CARE_PROVIDER_SITE_OTHER): Payer: BC Managed Care – PPO | Admitting: Physician Assistant

## 2016-10-10 VITALS — BP 104/66 | HR 68 | Temp 98.1°F | Resp 16 | Ht 64.0 in | Wt 262.0 lb

## 2016-10-10 DIAGNOSIS — Z6841 Body Mass Index (BMI) 40.0 and over, adult: Secondary | ICD-10-CM

## 2016-10-10 DIAGNOSIS — Z0000 Encounter for general adult medical examination without abnormal findings: Secondary | ICD-10-CM

## 2016-10-10 DIAGNOSIS — Z0001 Encounter for general adult medical examination with abnormal findings: Secondary | ICD-10-CM

## 2016-10-10 DIAGNOSIS — E669 Obesity, unspecified: Secondary | ICD-10-CM | POA: Diagnosis not present

## 2016-10-10 DIAGNOSIS — R42 Dizziness and giddiness: Secondary | ICD-10-CM

## 2016-10-10 DIAGNOSIS — IMO0001 Reserved for inherently not codable concepts without codable children: Secondary | ICD-10-CM

## 2016-10-10 MED ORDER — MECLIZINE HCL 12.5 MG PO TABS: 12.5000 mg | ORAL_TABLET | Freq: Three times a day (TID) | ORAL | 0 refills | Status: DC | PRN

## 2016-10-10 NOTE — Patient Instructions (Signed)

## 2016-10-10 NOTE — Telephone Encounter (Signed)
Lab result letter mailed to patient's address in chart.

## 2016-10-10 NOTE — Progress Notes (Signed)
Patient: Patricia Compton, Female    DOB: [birth year], 36 y.o.   MRN: [id number] Visit Date: 10/10/2016  Today's Provider: Trey Sailors, PA-C   Chief Complaint  Patient presents with  . Annual Exam   Subjective:    Annual physical exam Patricia Compton is a 36 y.o. female who presents today for health maintenance and complete physical. She feels fairly well. She reports not exercising. She reports she is sleeping well.  Married 13 years 2 kids - 8 and 4  No alcohol usage Never smoked Drugs: no  No history colon cancer   Breast cancer - mom and grandma had breast cancer. Mammogram in 10/02/2016 normal. Sees OBGYN. Last normal PAP 2016.  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 The current episode started more than 1 month ago (Started about six months ago.). The problem occurs rarely (Happens 1-2 times a month.). The problem has been unchanged. Associated symptoms include vertigo. Pertinent negatives include no abdominal pain, anorexia, arthralgias, change in bowel habit, chest pain, chills, congestion, coughing, diaphoresis, fatigue, fever, headaches, joint swelling, myalgias, nausea, neck pain, numbness, rash, sore throat, swollen glands, urinary symptoms, visual change, vomiting or weakness.   She feels like the room is spinning. Last time it happened she was folding laundry. Another time it happened she was getting out of bed. She has never passed out. No associated nausea. No shakiness, palpitations, headache, irritability. No trouble walking blurred vision.   Review of Systems  Constitutional: Negative.  Negative for chills, diaphoresis, fatigue and fever.  HENT: Negative.  Negative for congestion and sore throat.   Eyes: Negative.   Respiratory: Negative.  Negative for cough.   Cardiovascular: Negative.  Negative for chest pain.  Gastrointestinal: Positive  for diarrhea (Only happens occasionally). Negative for abdominal distention, abdominal pain, anal bleeding, anorexia, blood in stool, change in bowel habit, constipation, nausea, rectal pain and vomiting.  Endocrine: Negative.   Genitourinary: Negative.   Musculoskeletal: Negative.  Negative for arthralgias, joint swelling, myalgias and neck pain.  Skin: Negative.  Negative for rash.  Allergic/Immunologic: Negative.   Neurological: Positive for dizziness, vertigo and light-headedness. Negative for weakness, numbness and headaches.  Hematological: Negative.   Psychiatric/Behavioral: Negative.     Social History      She  reports that she has never smoked. She has never used smokeless tobacco. She reports that she does not drink alcohol or use drugs.       Social History   Social History  . Marital status: Married    Spouse name: N/A  . Number of children: N/A  . Years of education: N/A   Social History Main Topics  . Smoking status: Never Smoker  . Smokeless tobacco: Never Used  . Alcohol use No  . Drug use: No  . Sexual activity: Yes    Partners: Male    Birth control/ protection: Surgical   Other Topics Concern  . None   Social History Narrative  . None    Past Medical History:  Diagnosis Date  . GERD (gastroesophageal reflux disease)   . Morbid obesity [facility])      Patient Active Problem List   Diagnosis Date Noted  . Acid indigestion 02/08/2015  . Obesity 07/18/2011  . CN (constipation) 03/22/2006    Past Surgical History:  Procedure Laterality Date  . CESAREAN SECTION    . CESAREAN SECTION N/A 07/16/2012   Procedure: CESAREAN SECTION;  Surgeon: Catalina Antigua, MD;  Location: [facility];  Service: Obstetrics;  Laterality: N/A;  . CHOLECYSTECTOMY    . TUBAL LIGATION Bilateral 07/16/2012   Procedure: BILATERAL TUBAL LIGATION;  Surgeon: Catalina Antigua, MD;  Location: [facility];  Service: Obstetrics;  Laterality: Bilateral;  . WISDOM TOOTH EXTRACTION      Family  History        Family Status  Relation Status  . Mother Alive  . Father Alive  . MGM Deceased  . PGM Alive        Her family history includes Breast cancer (age of onset: 23) in her mother; Breast cancer (age of onset: 62) in her maternal grandmother; COPD in her father; Cancer in her maternal grandmother and mother; Hypertension in her mother; Lung cancer in her father.     No Known Allergies   Current Outpatient Prescriptions:  .  meclizine (ANTIVERT) 12.5 MG tablet, Take 1 tablet (12.5 mg total) by mouth 3 (three) times daily as needed for dizziness., Disp: 30 tablet, Rfl: 0   Patient Care Team: Chrismon, Jodell Cipro, PA as PCP - General (Family Medicine)      Objective:   Vitals: BP 104/66 (BP Location: Left Arm, Patient Position: Sitting, Cuff Size: Large)   Pulse 68   Temp 98.1 F (36.7 C) (Oral)   Resp 16   Ht 5\' 4"  (1.626 m)   Wt 262 lb (118.8 kg)   LMP 09/17/2016   BMI 44.97 kg/m    Vitals:   10/10/16 1017  BP: 104/66  Pulse: 68  Resp: 16  Temp: 98.1 F (36.7 C)  TempSrc: Oral  Weight: 262 lb (118.8 kg)  Height: 5\' 4"  (1.626 m)     Physical Exam   Depression Screen PHQ 2/9 Scores 10/10/2016  PHQ - 2 Score 0  PHQ- 9 Score 0      Assessment & Plan:     Routine Health Maintenance and Physical Exam  Exercise Activities and Dietary recommendations Goals    None      Immunization History  Administered Date(s) Administered  . Influenza Split 02/27/2012  . Rho (D) Immune Globulin 03/11/2012, 05/06/2012, 07/17/2012  . Tdap 04/22/2012    Health Maintenance  Topic Date Due  . INFLUENZA VACCINE  11/08/2016  . PAP SMEAR  10/07/2017  . TETANUS/TDAP  04/22/2022  . HIV Screening  Completed     Discussed health benefits of physical activity, and encouraged her to engage in regular exercise appropriate for her age and condition.    1. Annual physical exam  Labs as below.  - CBC with Differential/Platelet - Comprehensive metabolic panel -  TSH  2. Dizziness  Orthostatic vitals normal. No cardiac symptoms with dizziness, think EKG would be of little value now. Neuro exam normal. Sounds most like vertigo. Will give meclizine. Can further evaluate if symptoms persisting.  - CBC with Differential/Platelet  3. Class 3 obesity with body mass index (BMI) of 40.0 to 44.9 in adult, unspecified obesity type, unspecified whether serious comorbidity present (HCC)  - Hemoglobin A1c - Lipid panel  4. Vertigo  - meclizine (ANTIVERT) 12.5 MG tablet; Take 1 tablet (12.5 mg total) by mouth 3 (three) times daily as needed for dizziness.  Dispense: 30 tablet; Refill: 0  Return in about 1 year (around 10/10/2017) for CPE.  The entirety of the information documented in the History of Present Illness, Review of Systems and Physical Exam were personally obtained by me. Portions of this information were initially documented by  Kavin Leech, CMA and reviewed by me for thoroughness and accuracy.    --------------------------------------------------------------------    Trey Sailors, PA-C  [facility]

## 2016-10-18 ENCOUNTER — Ambulatory Visit (INDEPENDENT_AMBULATORY_CARE_PROVIDER_SITE_OTHER): Payer: BC Managed Care – PPO | Admitting: Family Medicine

## 2016-10-18 ENCOUNTER — Encounter: Payer: Self-pay | Admitting: Family Medicine

## 2016-10-18 VITALS — BP 113/76 | HR 56 | Resp 18 | Ht 64.0 in | Wt 261.0 lb

## 2016-10-18 DIAGNOSIS — Z113 Encounter for screening for infections with a predominantly sexual mode of transmission: Secondary | ICD-10-CM | POA: Diagnosis not present

## 2016-10-18 DIAGNOSIS — Z01419 Encounter for gynecological examination (general) (routine) without abnormal findings: Secondary | ICD-10-CM | POA: Diagnosis not present

## 2016-10-18 DIAGNOSIS — Z803 Family history of malignant neoplasm of breast: Secondary | ICD-10-CM

## 2016-10-18 NOTE — Progress Notes (Addendum)
CLINIC ENCOUNTER NOTE  History:  36 y.o. [id number] here today for routine GYN care. She denies any abnormal vaginal discharge, bleeding, pelvic pain or other concerns.   Past Medical History:  Diagnosis Date  . GERD (gastroesophageal reflux disease)   . Morbid obesity ([place])     Past Surgical History:  Procedure Laterality Date  . CESAREAN SECTION    . CESAREAN SECTION N/A 07/16/2012   Procedure: CESAREAN SECTION;  Surgeon: Mora Bellman, MD;  Location: [place] [facility];  Service: Obstetrics;  Laterality: N/A;  . CHOLECYSTECTOMY    . TUBAL LIGATION Bilateral 07/16/2012   Procedure: BILATERAL TUBAL LIGATION;  Surgeon: Mora Bellman, MD;  Location: [place] [facility];  Service: Obstetrics;  Laterality: Bilateral;  . WISDOM TOOTH EXTRACTION      The following portions of the patient's history were reviewed and updated as appropriate: allergies, current medications, past family history, past medical history, past social history, past surgical history and problem list.   Health Maintenance:  Normal pap and negative HRHPV on 2016.   Review of Systems:  Pertinent items noted in HPI and remainder of comprehensive ROS otherwise negative.   Objective:  Physical Exam BP 113/76   Pulse (!) 56   Resp 18   Ht _0  (1.626 m)   Wt 261 lb (118.4 kg)   LMP 10/12/2016   BMI 44.80 kg/m  CONSTITUTIONAL: Well-developed, well-nourished female in no acute distress.  HENT:  Normocephalic, atraumatic. External right and left ear normal. Oropharynx is clear and moist EYES: Conjunctivae and EOM are normal. Pupils are equal, round, and reactive to light. No scleral icterus.  NECK: Normal range of motion, supple, no masses SKIN: Skin is warm and dry. No rash noted. Not diaphoretic. No erythema. No pallor. [place]: Alert and oriented to person, place, and time. Normal reflexes, muscle tone coordination. No cranial nerve deficit noted. PSYCHIATRIC: Normal mood and affect. Normal behavior. Normal judgment and thought  content. CARDIOVASCULAR: Normal heart rate noted RESPIRATORY: Effort and breath sounds normal, no problems with respiration noted BREAST: symmetric. No lesions or masses noted. No nipple retraction on discharge. ABDOMEN: Soft, no distention noted.   PELVIC: Normal appearing external genitalia; normal appearing vaginal mucosa and cervix.  No abnormal discharge noted.  Normal uterine size, no other palpable masses, no uterine or adnexal tenderness. MUSCULOSKELETAL: Normal range of motion. No edema noted.  Labs and Imaging Mm Digital Screening Bilateral  Result Date: 10/02/2016 CLINICAL DATA:  Screening. EXAM: DIGITAL SCREENING BILATERAL MAMMOGRAM WITH CAD COMPARISON:  None. ACR Breast Density Category b: There are scattered areas of fibroglandular density. FINDINGS: There are no findings suspicious for malignancy. Images were processed with CAD. IMPRESSION: No mammographic evidence of malignancy. A result letter of this screening mammogram will be mailed directly to the patient. RECOMMENDATION: Screening mammogram at age 1. (Code:SM-B-40A) BI-RADS CATEGORY  1: Negative. Electronically Signed   By: Lovey Newcomer M.D.   On: 10/02/2016 09:46    Assessment & Plan:   1. Family history of breast cancer Mother with BCa at age 17 with recurrence at 16 and then 2 additional maternal relatives wit Breast Cancer- High risk and needs regular screenings (mammogram and MRI yearly) Discussed BRCA testing and she will speak with mother and is very nervous about testing Recommend discussing genetic testing at each visit  2. Venereal disease screening - GC/Chlamydia probe amp ([place])not at [facility] - HIV antibody - RPR  3. Well woman exam with routine gynecological exam No pap indicated, pap in 2016 was  NIL with neg HPV. Next in 2021 Reviewed weight loss Discussed health lifestyle changes BTL for contraception   Routine preventative health maintenance measures emphasized. Please refer to After Visit  Summary for other counseling recommendations.   Return in about 1 year (around 10/18/2017) for Yearly wellness exam.

## 2016-10-18 NOTE — Patient Instructions (Signed)
Preventive Care 18-39 Years, Female Preventive care refers to lifestyle choices and visits with your health care provider that can promote health and wellness. What does preventive care include?  A yearly physical exam. This is also called an annual well check.  Dental exams once or twice a year.  Routine eye exams. Ask your health care provider how often you should have your eyes checked.  Personal lifestyle choices, including: ? Daily care of your teeth and gums. ? Regular physical activity. ? Eating a healthy diet. ? Avoiding tobacco and drug use. ? Limiting alcohol use. ? Practicing safe sex. ? Taking vitamin and mineral supplements as recommended by your health care provider. What happens during an annual well check? The services and screenings done by your health care provider during your annual well check will depend on your age, overall health, lifestyle risk factors, and family history of disease. Counseling Your health care provider may ask you questions about your:  Alcohol use.  Tobacco use.  Drug use.  Emotional well-being.  Home and relationship well-being.  Sexual activity.  Eating habits.  Work and work Statistician.  Method of birth control.  Menstrual cycle.  Pregnancy history.  Screening You may have the following tests or measurements:  Height, weight, and BMI.  Diabetes screening. This is done by checking your blood sugar (glucose) after you have not eaten for a while (fasting).  Blood pressure.  Lipid and cholesterol levels. These may be checked every 5 years starting at age 66.  Skin check.  Hepatitis C blood test.  Hepatitis B blood test.  Sexually transmitted disease (STD) testing.  BRCA-related cancer screening. This may be done if you have a family history of breast, ovarian, tubal, or peritoneal cancers.  Pelvic exam and Pap test. This may be done every 3 years starting at age 40. Starting at age 59, this may be done every 5  years if you have a Pap test in combination with an HPV test.  Discuss your test results, treatment options, and if necessary, the need for more tests with your health care provider. Vaccines Your health care provider may recommend certain vaccines, such as:  Influenza vaccine. This is recommended every year.  Tetanus, diphtheria, and acellular pertussis (Tdap, Td) vaccine. You may need a Td booster every 10 years.  Varicella vaccine. You may need this if you have not been vaccinated.  HPV vaccine. If you are 69 or younger, you may need three doses over 6 months.  Measles, mumps, and rubella (MMR) vaccine. You may need at least one dose of MMR. You may also need a second dose.  Pneumococcal 13-valent conjugate (PCV13) vaccine. You may need this if you have certain conditions and were not previously vaccinated.  Pneumococcal polysaccharide (PPSV23) vaccine. You may need one or two doses if you smoke cigarettes or if you have certain conditions.  Meningococcal vaccine. One dose is recommended if you are age 27-21 years and a first-year college student living in a residence hall, or if you have one of several medical conditions. You may also need additional booster doses.  Hepatitis A vaccine. You may need this if you have certain conditions or if you travel or work in places where you may be exposed to hepatitis A.  Hepatitis B vaccine. You may need this if you have certain conditions or if you travel or work in places where you may be exposed to hepatitis B.  Haemophilus influenzae type b (Hib) vaccine. You may need this if  you have certain risk factors.  Talk to your health care provider about which screenings and vaccines you need and how often you need them. This information is not intended to replace advice given to you by your health care provider. Make sure you discuss any questions you have with your health care provider. Document Released: 05/23/2001 Document Revised: 12/15/2015  Document Reviewed: 01/26/2015 Elsevier Interactive Patient Education  2017 [employer].

## 2016-10-19 ENCOUNTER — Telehealth: Payer: Self-pay

## 2016-10-19 LAB — CBC WITH DIFFERENTIAL/PLATELET
Basophils Absolute: 0.1 10*3/uL (ref 0.0–0.2)
Basos: 1 %
EOS (ABSOLUTE): 0.1 10*3/uL (ref 0.0–0.4)
Eos: 2 %
Hematocrit: 39.2 % (ref 34.0–46.6)
Hemoglobin: 12.9 g/dL (ref 11.1–15.9)
Immature Grans (Abs): 0 10*3/uL (ref 0.0–0.1)
Immature Granulocytes: 0 %
Lymphocytes Absolute: 2.3 10*3/uL (ref 0.7–3.1)
Lymphs: 47 %
MCH: 30 pg (ref 26.6–33.0)
MCHC: 32.9 g/dL (ref 31.5–35.7)
MCV: 91 fL (ref 79–97)
Monocytes Absolute: 0.4 10*3/uL (ref 0.1–0.9)
Monocytes: 7 %
Neutrophils Absolute: 2.2 10*3/uL (ref 1.4–7.0)
Neutrophils: 43 %
Platelets: 280 10*3/uL (ref 150–379)
RBC: 4.3 x10E6/uL (ref 3.77–5.28)
RDW: 13.4 % (ref 12.3–15.4)
WBC: 5.1 10*3/uL (ref 3.4–10.8)

## 2016-10-19 LAB — COMPREHENSIVE METABOLIC PANEL
ALT: 12 IU/L (ref 0–32)
AST: 17 IU/L (ref 0–40)
Albumin/Globulin Ratio: 1.5 (ref 1.2–2.2)
Albumin: 4.2 g/dL (ref 3.5–5.5)
Alkaline Phosphatase: 62 IU/L (ref 39–117)
BUN/Creatinine Ratio: 16 (ref 9–23)
BUN: 11 mg/dL (ref 6–20)
Bilirubin Total: 0.6 mg/dL (ref 0.0–1.2)
CO2: 23 mmol/L (ref 20–29)
Calcium: 9.3 mg/dL (ref 8.7–10.2)
Chloride: 102 mmol/L (ref 96–106)
Creatinine, Ser: 0.69 mg/dL (ref 0.57–1.00)
GFR calc Af Amer: 130 mL/min/{1.73_m2} (ref >59)
GFR calc non Af Amer: 112 mL/min/{1.73_m2} (ref >59)
Globulin, Total: 2.8 g/dL (ref 1.5–4.5)
Glucose: 77 mg/dL (ref 65–99)
Potassium: 4.4 mmol/L (ref 3.5–5.2)
Sodium: 142 mmol/L (ref 134–144)
Total Protein: 7 g/dL (ref 6.0–8.5)

## 2016-10-19 LAB — LIPID PANEL
Chol/HDL Ratio: 3.7 ratio (ref 0.0–4.4)
Cholesterol, Total: 194 mg/dL (ref 100–199)
HDL: 52 mg/dL (ref >39)
LDL Calculated: 126 mg/dL — ABNORMAL HIGH (ref 0–99)
Triglycerides: 80 mg/dL (ref 0–149)
VLDL Cholesterol Cal: 16 mg/dL (ref 5–40)

## 2016-10-19 LAB — TSH: TSH: 2.77 u[IU]/mL (ref 0.450–4.500)

## 2016-10-19 LAB — HEMOGLOBIN A1C
Est. average glucose Bld gHb Est-mCnc: 97 mg/dL
Hgb A1c MFr Bld: 5 % (ref 4.8–5.6)

## 2016-10-19 LAB — RPR: RPR Ser Ql: NONREACTIVE

## 2016-10-19 LAB — HIV ANTIBODY (ROUTINE TESTING W REFLEX): HIV SCREEN 4TH GENERATION: NONREACTIVE

## 2016-10-19 NOTE — Telephone Encounter (Signed)
-----   Message from Trey Sailors, [place] sent at 10/19/2016  9:07 AM EDT ----- Loney Laurence all completely normal. A1C is normal. Recommend reassessing if symptoms continue or worsen.

## 2016-10-19 NOTE — Telephone Encounter (Signed)
LMTCB

## 2016-10-20 LAB — GC/CHLAMYDIA PROBE AMP (~~LOC~~) NOT AT ARMC
CHLAMYDIA, DNA PROBE: NEGATIVE
NEISSERIA GONORRHEA: NEGATIVE

## 2016-10-25 NOTE — Telephone Encounter (Signed)
LMTCB 10/25/2016  Thanks,   -Laura  

## 2016-10-31 NOTE — Telephone Encounter (Signed)
Letter mailed to address in chart advising patient of normal lab results.

## 2017-02-28 ENCOUNTER — Encounter: Payer: Self-pay | Admitting: Physician Assistant

## 2017-02-28 ENCOUNTER — Ambulatory Visit (INDEPENDENT_AMBULATORY_CARE_PROVIDER_SITE_OTHER): Payer: BC Managed Care – PPO | Admitting: Physician Assistant

## 2017-02-28 VITALS — BP 118/82 | HR 68 | Temp 97.6°F | Resp 16 | Wt 262.0 lb

## 2017-02-28 DIAGNOSIS — Z20818 Contact with and (suspected) exposure to other bacterial communicable diseases: Secondary | ICD-10-CM

## 2017-02-28 DIAGNOSIS — R05 Cough: Secondary | ICD-10-CM | POA: Diagnosis not present

## 2017-02-28 DIAGNOSIS — R059 Cough, unspecified: Secondary | ICD-10-CM

## 2017-02-28 MED ORDER — AZITHROMYCIN 250 MG PO TABS: ORAL_TABLET | ORAL | 0 refills | Status: DC

## 2017-02-28 NOTE — Progress Notes (Signed)
Patient: Patricia Compton Female    DOB: 1981-03-04   36 y.o.   MRN: [id number] Visit Date: 03/05/2017  Today's Provider: Trey Sailors, PA-C   Chief Complaint  Patient presents with  . Cough   Subjective:    HPI Pt is here for a cough that she has for a month. She has tried antihistamines and OTC cold medications. Nothing is helping. She reports that her son is being treated for Pertussis because of the holiday they are not doing tesingt. She is worried that she is getting him sick and wants to be checked.  She denies any chest pain,shortness of breath, sputum production, fevers, chills, sweats. She says she does not even feel bad. She does report a lot of post nasal drainage. She reports that she thinks she has had some wheezing but no chest tightness.       No Known Allergies   Current Outpatient Medications:  .  azithromycin (ZITHROMAX) 250 MG tablet, Take 2 pills on day 1, then 1 pill on each of the following four days., Disp: 6 tablet, Rfl: 0  Review of Systems  Constitutional: Negative.   HENT: Positive for congestion and postnasal drip.   Eyes: Negative.   Respiratory: Positive for cough and wheezing.   Cardiovascular: Negative.   Gastrointestinal: Negative.   Endocrine: Negative.   Genitourinary: Negative.   Musculoskeletal: Negative.   Skin: Negative.   Allergic/Immunologic: Negative.   Neurological: Negative.   Hematological: Negative.   Psychiatric/Behavioral: Negative.     Social History   Tobacco Use  . Smoking status: Never Smoker  . Smokeless tobacco: Never Used  Substance Use Topics  . Alcohol use: No   Objective:   BP 118/82 (BP Location: Left Arm, Patient Position: Sitting, Cuff Size: Large)   Pulse 68   Temp 97.6 F (36.4 C) (Oral)   Resp 16   Wt 262 lb (118.8 kg)   SpO2 99%   BMI 44.97 kg/m  Vitals:   02/28/17 1621  BP: 118/82  Pulse: 68  Resp: 16  Temp: 97.6 F (36.4 C)  TempSrc: Oral  SpO2: 99%  Weight: 262 lb  (118.8 kg)     Physical Exam  Constitutional: She appears well-developed and well-nourished.  HENT:  Right Ear: External ear normal.  Left Ear: External ear normal.  Mouth/Throat: Oropharynx is clear and moist. No oropharyngeal exudate.  Eyes: Right eye exhibits no discharge. Left eye exhibits no discharge.  Neck: Neck supple.  Cardiovascular: Normal rate and regular rhythm.  Pulmonary/Chest: Effort normal and breath sounds normal. No respiratory distress. She has no wheezes. She has no rales.  Lymphadenopathy:    She has no cervical adenopathy.  Skin: Skin is warm and dry.  Psychiatric: She has a normal mood and affect. Her behavior is normal.        Assessment & Plan:     1. Cough  With son being treated for pertussis, it is possible she could have this and exposed him, or the opposite. Treat as below. Continue allergy treatment.  - azithromycin (ZITHROMAX) 250 MG tablet; Take 2 pills on day 1, then 1 pill on each of the following four days.  Dispense: 6 tablet; Refill: 0  2. Pertussis exposure  - azithromycin (ZITHROMAX) 250 MG tablet; Take 2 pills on day 1, then 1 pill on each of the following four days.  Dispense: 6 tablet; Refill: 0   Return if symptoms worsen or fail to improve.  The entirety of the information documented in the History of Present Illness, Review of Systems and Physical Exam were personally obtained by me. Portions of this information were initially documented by EritreaBrittany O'Dell, CMA and reviewed by me for thoroughness and accuracy.           Trey SailorsAdriana M Pollak, PA-C  [facility]

## 2017-02-28 NOTE — Patient Instructions (Signed)
Pertussis, Adult Pertussis, also called whooping cough, is an infection that causes severe and sudden coughing attacks. Pertussis spreads easily from person to person (is contagious). It spreads through the droplets that are sprayed in the air when an infected person talks, coughs, or sneezes. Symptoms of pertussis can last for up to 6 weeks, even though the cough starts to get better. It may take as long as 6 months for the cough to go away completely. What are the causes? This condition is caused by bacteria. The bacteria can spread to someone when he or she:  Inhales droplets that have been sprayed in the air by an infected person.  Touches a surface where the droplets fell and then touches his or her mouth or nose.  What are the signs or symptoms? Symptoms of this condition include cold-like symptoms, such as:  A runny nose.  Low fever.  Mild cough.  Red, watery eyes.  These symptoms develop at the beginning of the infection. After 1-2 weeks, the cold symptoms get better, but the cough worsens, and severe and sudden coughing attacks frequently develop. During these attacks, people may cough so hard that vomiting occurs. How is this diagnosed? This condition may be diagnosed by:  A physical exam.  Lab tests of mucus from the nose and throat.  A blood test.  A chest X-ray.  How is this treated? This condition is treated with antibiotic medicines.  Starting antibiotics quickly may help shorten the illness and make it less contagious.  Antibiotics may also be prescribed for everyone who lives in the same household.   Immunization may be recommended for people in the household who are at risk of developing pertussis. At-risk groups include: ? Infants. ? Those who have not had their full course of pertussis immunizations. ? Those who were immunized but have not had their recent booster shot. Mild coughing may continue for months after the infection is treated. This coughing  may be due to the remaining soreness and inflammation in the lungs. Follow these instructions at home: Medicines   Take over-the-counter and prescription medicines only as told by your health care provider.  Take your antibiotic medicine as told by your health care provider. Do not stop taking the antibiotic even if you start to feel better.  Do not take cough medicine unless told by your health care provider. Coughing attack  If you are having a coughing attack: ? Raise (elevate) the head of your mattress or raise your head with pillows to improve breathing. This will also make it easier to clear out mucus that is brought up from the lungs to the throat during a cough (sputum). ? Sit upright.  Avoid substances that may irritate the lungs, such as smoke, aerosols, or fumes. These substances may make your coughing worse.  Use a cool mist humidifier at home to increase air moisture. This will soothe your cough and help to loosen sputum. Do not use hot steam. Prevent infection  For the first 5 days of antibiotic treatment, stay away from those who are at risk of developing pertussis. If no antibiotics are prescribed, stay at home for the first 3 weeks that you are coughing or as told by your health care provider.  Do not go to work until you have been treated with antibiotics for 5 days. If no antibiotics are prescribed, do not go to work for the first 3 weeks that you are coughing or as told by your health care provider. Tell your workplace  that you were diagnosed with pertussis.  Wash your hands often with soap and water. Everyone in your household should also wash hands often to avoid spreading the infection. If soap and water are not available, use hand sanitizer. General instructions  Rest as much as possible. Slowly go back to your normal activities as told by your health care provider.  Drink enough fluid to keep your urine clear or pale yellow.  Keep all follow-up visits as told  by your health care provider. This is important. How is this prevented?  Pertussis can be prevented with a vaccine and later booster shots.  The pertussis vaccine is given during childhood.  If you are an adult who was never vaccinated, get vaccinated as soon as possible.  If you are an adult who was previously vaccinated, talk with your health care providers about the need for a booster shot because immunity from the vaccine decreases over time.  All of the following people should consider receiving a booster dose of pertussis: ? Pregnant women. ? Everyone who has or will have close contact with an infant who is less than 912 months old. ? All health care personnel. Contact a health care provider if:  You cannot stop vomiting.  You are not able to eat or drink.  Your cough does not improve.  You have a fever. Get help right away if:  Your face turns red or blue during a coughing attack.  You pass out after a coughing attack, even if only for a few moments.  Your breathing stops for a period of time (apnea).  You are restless or cannot sleep.  You feel sluggish or you are sleeping too much. This information is not intended to replace advice given to you by your health care provider. Make sure you discuss any questions you have with your health care provider. Document Released: 07/22/2012 Document Revised: 10/16/2015 Document Reviewed: 09/13/2015 Elsevier Interactive Patient Education  [employer]2018 Elsevier Inc[employer]

## 2017-08-21 ENCOUNTER — Encounter: Payer: Self-pay | Admitting: Radiology

## 2017-11-18 ENCOUNTER — Ambulatory Visit
Admission: EM | Admit: 2017-11-18 | Discharge: 2017-11-18 | Disposition: A | Discharge status: Home / Self Care | Payer: BC Managed Care – PPO | Attending: Family Medicine | Admitting: Family Medicine

## 2017-11-18 ENCOUNTER — Other Ambulatory Visit: Payer: Self-pay

## 2017-11-18 DIAGNOSIS — H10021 Other mucopurulent conjunctivitis, right eye: Secondary | ICD-10-CM

## 2017-11-18 MED ORDER — MOXIFLOXACIN HCL 0.5 % OP SOLN: 1.0000 [drp] | Freq: Three times a day (TID) | OPHTHALMIC | 0 refills | Status: DC

## 2017-11-18 NOTE — ED Provider Notes (Signed)
[facility]    CSN: [id number] Arrival date & time: 11/18/17  1039     History   Chief Complaint Chief Complaint  Patient presents with  . Conjunctivitis    HPI Patricia Compton is a 37 y.o. female.   37 yo female with a c/o right eye redness and drainage for one day. Denies any fevers, chills, injury.   The history is provided by the patient.  Conjunctivitis     Past Medical History:  Diagnosis Date  . GERD (gastroesophageal reflux disease)   . Morbid obesity [facility])     Patient Active Problem List   Diagnosis Date Noted  . Family history of breast cancer 10/18/2016  . Acid indigestion 02/08/2015  . Obesity 07/18/2011  . CN (constipation) 03/22/2006    Past Surgical History:  Procedure Laterality Date  . CESAREAN SECTION    . CESAREAN SECTION N/A 07/16/2012   Procedure: CESAREAN SECTION;  Surgeon: Catalina Antigua, MD;  Location: [facility] OR[facility];  Service: Obstetrics;  Laterality: N/A;  . CHOLECYSTECTOMY    . TUBAL LIGATION Bilateral 07/16/2012   Procedure: BILATERAL TUBAL LIGATION;  Surgeon: Catalina Antigua, MD;  Location: [facility] ORS;  Service: Obstetrics;  Laterality: Bilateral;  . WISDOM TOOTH EXTRACTION      OB History    Gravida  2   Para  2   Term  2   Preterm      AB      Living  2     SAB      TAB      Ectopic      Multiple      Live Births  2            Home Medications    Prior to Admission medications   Medication Sig Start Date End Date Taking? Authorizing Provider  azithromycin (ZITHROMAX) 250 MG tablet Take 2 pills on day 1, then 1 pill on each of the following four days. 02/28/17   Trey Sailors, PA-C  moxifloxacin (VIGAMOX) 0.5 % ophthalmic solution Place 1 drop into the right eye 3 (three) times daily. 11/18/17   Payton Mccallum, MD    Family History Family History  Problem Relation Age of Onset  . Hypertension Mother   . Cancer Mother        breast  . Breast cancer Mother 1  . COPD Father   . Lung cancer  Father   . Cancer Maternal Grandmother   . Breast cancer Maternal Grandmother 60  . Breast cancer Maternal Aunt 60    Social History Social History   Tobacco Use  . Smoking status: Never Smoker  . Smokeless tobacco: Never Used  Substance Use Topics  . Alcohol use: No  . Drug use: No     Allergies   Patient has no known allergies.   Review of Systems Review of Systems   Physical Exam Triage Vital Signs ED Triage Vitals [11/18/17 1054]  Enc Vitals Group     BP      Pulse      Resp      Temp      Temp src      SpO2      Weight 240 lb (108.9 kg)     Height 5\' 4"  (1.626 m)     Head Circumference      Peak Flow      Pain Score 0     Pain Loc      Pain Edu?  Excl. in GC?    No data found.  Updated Vital Signs BP 131/83 (BP Location: Right Arm)   Pulse 70   Temp 98.4 F (36.9 C) (Oral)   Resp 18   Ht 5\' 4"  (1.626 m)   Wt 108.9 kg   LMP 10/27/2017   SpO2 100%   BMI 41.20 kg/m   Visual Acuity Right Eye Distance:   Left Eye Distance:   Bilateral Distance:    Right Eye Near:   Left Eye Near:    Bilateral Near:     Physical Exam  Constitutional: She appears well-developed and well-nourished. No distress.  Eyes: Pupils are equal, round, and reactive to light. EOM are normal. Right eye exhibits discharge. Right conjunctiva is injected.  Skin: She is not diaphoretic.  Nursing note and vitals reviewed.    UC Treatments / Results  Labs (all labs ordered are listed, but only abnormal results are displayed) Labs Reviewed - No data to display  EKG None  Radiology No results found.  Procedures Procedures (including critical care time)  Medications Ordered in UC Medications - No data to display  Initial Impression / Assessment and Plan / UC Course  I have reviewed the triage vital signs and the nursing notes.  Pertinent labs & imaging results that were available during my care of the patient were reviewed by me and considered in my medical  decision making (see chart for details).      Final Clinical Impressions(s) / UC Diagnoses   Final diagnoses:  Other mucopurulent conjunctivitis of right eye    ED Prescriptions    Medication Sig Dispense Auth. Provider   moxifloxacin (VIGAMOX) 0.5 % ophthalmic solution Place 1 drop into the right eye 3 (three) times daily. 3 mL Payton Mccallum, MD     1. diagnosis reviewed with patient 2. rx as per orders above; reviewed possible side effects, interactions, risks and benefits  3. Recommend supportive treatment with compresses to area 4. Follow-up prn if symptoms worsen or don't improve   Controlled Substance Prescriptions [place] Controlled Substance Registry consulted? Not Applicable   Payton Mccallum, MD 11/18/17 [phone]

## 2017-11-18 NOTE — ED Triage Notes (Addendum)
Right eye redness, itchy and woke up with crust this morning VISUAL ACUITY: Right eye corrected 20/30 Left eye corrected  20/25 Both eyes corrected 20/25

## 2018-01-30 ENCOUNTER — Ambulatory Visit: Payer: BC Managed Care – PPO | Admitting: Family Medicine

## 2018-01-30 DIAGNOSIS — Z01419 Encounter for gynecological examination (general) (routine) without abnormal findings: Secondary | ICD-10-CM

## 2019-01-28 ENCOUNTER — Ambulatory Visit: Payer: BC Managed Care – PPO | Admitting: Physician Assistant

## 2019-01-28 ENCOUNTER — Encounter: Payer: Self-pay | Admitting: Physician Assistant

## 2019-01-28 ENCOUNTER — Other Ambulatory Visit: Payer: Self-pay

## 2019-01-28 VITALS — BP 102/60 | HR 69 | Temp 96.8°F | Resp 16 | Wt 292.4 lb

## 2019-01-28 DIAGNOSIS — R200 Anesthesia of skin: Secondary | ICD-10-CM | POA: Diagnosis not present

## 2019-01-28 DIAGNOSIS — G6289 Other specified polyneuropathies: Secondary | ICD-10-CM

## 2019-01-28 DIAGNOSIS — R202 Paresthesia of skin: Secondary | ICD-10-CM

## 2019-01-28 NOTE — Progress Notes (Signed)
Patient: Patricia Compton Female    DOB: [birth year]   38 y.o.   MRN: [id number] Visit Date: 01/28/2019  Today's Provider: Trey Sailors, PA-C   Chief Complaint  Patient presents with  . Numbness   Subjective:     HPI  Patient comes in office today with concerns of numbness and tingling in the right foot radiating into her right leg for the past two weeks. Patient states that over the past two days she has noticed numbness and tingling around her toes on her left foot. Concerned she has issues with diabetes and circulation. She is having no pain but is having tingling and numbness. Does not have pain in her low back. Sometimes she has right heel pain. The pain is worse in her right foot than her left. She denies rashes, joint pain aside from knees hurting from being active. Sometimes numbness and tingling in her hands. 2 weeks, no vision changes, no falling, no balance issues.   Wt Readings from Last 3 Encounters:  01/28/19 292 lb 6.4 oz (132.6 kg)  11/18/17 240 lb (108.9 kg)  02/28/17 262 lb (118.8 kg)   BP Readings from Last 5 Encounters:  01/28/19 102/60  11/18/17 131/83  02/28/17 118/82  10/18/16 113/76  10/10/16 104/66     No Known Allergies  No current outpatient medications on file.  Review of Systems  Constitutional: Negative.   HENT: Negative.   Respiratory: Negative.   Gastrointestinal: Negative.   Musculoskeletal: Positive for arthralgias.    Social History   Tobacco Use  . Smoking status: Never Smoker  . Smokeless tobacco: Never Used  Substance Use Topics  . Alcohol use: No      Objective:   BP 102/60   Pulse 69   Temp (!) 96.8 F (36 C) (Oral)   Resp 16   Wt 292 lb 6.4 oz (132.6 kg)   BMI 50.19 kg/m  Vitals:   01/28/19 1526  BP: 102/60  Pulse: 69  Resp: 16  Temp: (!) 96.8 F (36 C)  TempSrc: Oral  Weight: 292 lb 6.4 oz (132.6 kg)  Body mass index is 50.19 kg/m.   Physical Exam Constitutional:      Appearance:  Normal appearance. She is obese.  Cardiovascular:     Comments: Cap refill <3s in bilateral lower extremities  Musculoskeletal:     Right lower leg: No edema.     Left lower leg: No edema.  Skin:    General: Skin is warm and dry.  Neurological:     Mental Status: She is alert and oriented to person, place, and time. Mental status is at baseline.     Sensory: Sensory deficit present.     Motor: Motor function is intact. No weakness, tremor or abnormal muscle tone.     Comments: She has some diminished sensation on her right foot that increases proximally.   Psychiatric:        Mood and Affect: Mood normal.        Behavior: Behavior normal.      No results found for any visits on 01/28/19.     Assessment & Plan    1. Right leg numbness  She is not having any pain and the location does not start in her back or buttocks and radiate down into her foot. Instead, the numbness and tingling begins in her feet and worsens proximally. We will check labwork for causes of peripheral neuropathy. Her symptoms do not  sound consistent with lumbar radiculopathy. Her feet are warm and well perfused and I do not suspect circulatory issues. We will also start neurology referral which may be cancelled if labwork reveals obvious treatable cause.   - HgB A1c - TSH - B12 - Ambulatory referral to Neurology  2. Numbness and tingling  - HgB A1c - TSH - B12 - Ambulatory referral to Neurology  3. Other polyneuropathy  - Ambulatory referral to Neurology  The entirety of the information documented in the History of Present Illness, Review of Systems and Physical Exam were personally obtained by me. Portions of this information were initially documented by Jennings Books, CMA and reviewed by me for thoroughness and accuracy.         Trinna Post, PA-C  [facility]

## 2019-01-28 NOTE — Patient Instructions (Signed)

## 2019-01-29 LAB — TSH: TSH: 1.82 u[IU]/mL (ref 0.450–4.500)

## 2019-01-29 LAB — HEMOGLOBIN A1C
Est. average glucose Bld gHb Est-mCnc: 105 mg/dL
Hgb A1c MFr Bld: 5.3 % (ref 4.8–5.6)

## 2019-01-29 LAB — VITAMIN B12: Vitamin B-12: 249 pg/mL (ref 232–1245)

## 2019-04-11 DIAGNOSIS — G35 Multiple sclerosis: Secondary | ICD-10-CM

## 2019-04-11 HISTORY — DX: Multiple sclerosis: G35

## 2019-04-21 ENCOUNTER — Telehealth: Payer: Self-pay

## 2019-04-21 ENCOUNTER — Encounter: Payer: Self-pay | Admitting: Physician Assistant

## 2019-04-21 NOTE — Telephone Encounter (Signed)
Contacted patient and appointment was scheduled.

## 2019-04-24 NOTE — Progress Notes (Signed)
Called patient X2 as she was not available on MyChart video. Called at 1:53 and 2:15 PM. No Answer. LMTCB. No show.

## 2019-04-25 ENCOUNTER — Other Ambulatory Visit: Payer: Self-pay | Admitting: Acute Care

## 2019-04-25 ENCOUNTER — Telehealth (INDEPENDENT_AMBULATORY_CARE_PROVIDER_SITE_OTHER): Payer: BC Managed Care – PPO | Admitting: Physician Assistant

## 2019-04-25 ENCOUNTER — Other Ambulatory Visit (HOSPITAL_COMMUNITY): Payer: Self-pay | Admitting: Acute Care

## 2019-04-25 DIAGNOSIS — G35 Multiple sclerosis: Secondary | ICD-10-CM

## 2019-04-25 DIAGNOSIS — Z5329 Procedure and treatment not carried out because of patient's decision for other reasons: Secondary | ICD-10-CM

## 2019-04-25 DIAGNOSIS — Z91199 Patient's noncompliance with other medical treatment and regimen due to unspecified reason: Secondary | ICD-10-CM

## 2019-05-02 ENCOUNTER — Ambulatory Visit
Admission: RE | Admit: 2019-05-02 | Discharge: 2019-05-02 | Disposition: A | Discharge status: Home / Self Care | Payer: BC Managed Care – PPO | Source: Ambulatory Visit | Attending: Acute Care | Admitting: Acute Care

## 2019-05-02 ENCOUNTER — Other Ambulatory Visit: Payer: Self-pay

## 2019-05-02 DIAGNOSIS — G35 Multiple sclerosis: Secondary | ICD-10-CM | POA: Insufficient documentation

## 2019-05-02 IMAGING — MR MR HEAD WO/W CM
15 series · 46 of 48 positions shown · IV contrast (gadavist)
Comparison: No pertinent prior studies available for comparison.

CLINICAL DATA: Multiple sclerosis. Additional history provided by
technologist: Right-sided numbness: Tingling in hands, legs and feet
since [REDACTED].

EXAM:
MRI HEAD WITHOUT AND WITH CONTRAST
TECHNIQUE: Multiplanar, multiecho pulse sequences of the brain and surrounding
structures were obtained without and with intravenous contrast.
CONTRAST:  10mL GADAVIST GADOBUTROL 1 MMOL/ML IV SOLN

[Series 5: ax dwi_tracew · axial · 3.0mm · 0.60mm/px · z∈[-79,+76]mm · 3 of 48 slices shown]
[im 1/48]
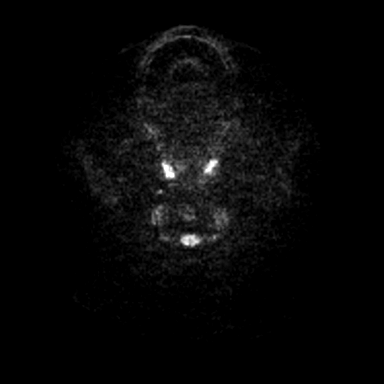
[im 24/48]
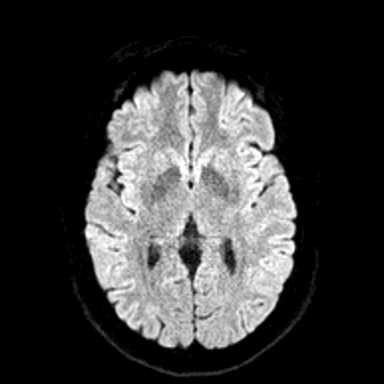
[im 48/48]
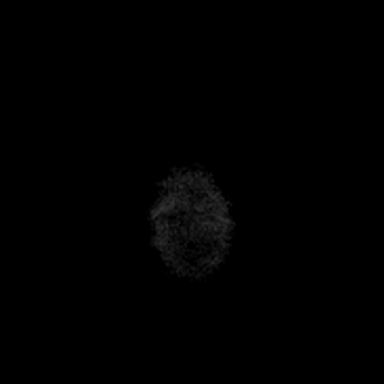

[Series 6: ax dwi_adc · axial · 3.0mm · 0.60mm/px · z∈[-79,+76]mm · 3 of 48 slices shown]
[im 1/48]
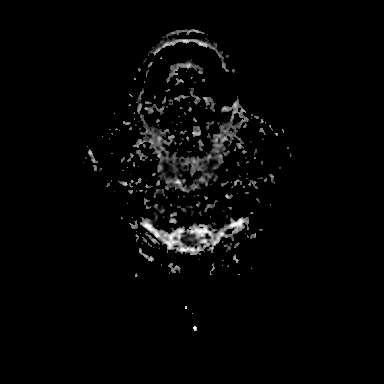
[im 24/48]
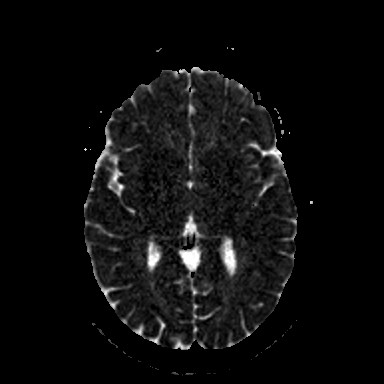
[im 48/48]
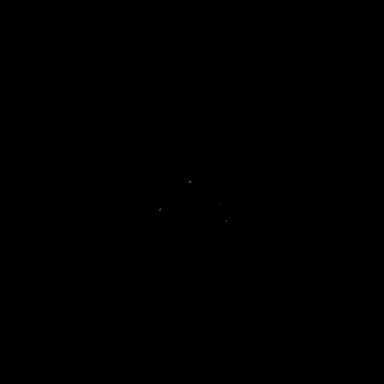

[Series 7: cor dwi_tracew · coronal · 5.0mm · 0.68mm/px · 2 of 40 slices shown]
[im 1/40]
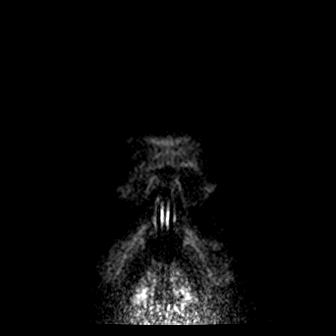
[im 40/40]
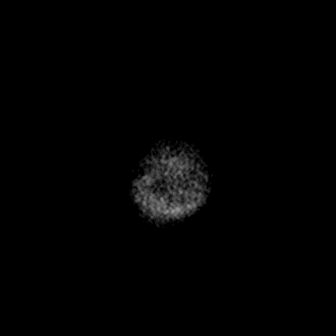

[Series 8: cor dwi_adc · coronal · 5.0mm · 0.68mm/px · 2 of 40 slices shown]
[im 1/40]
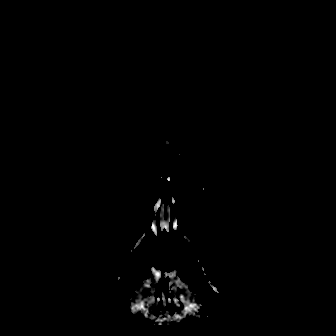
[im 40/40]
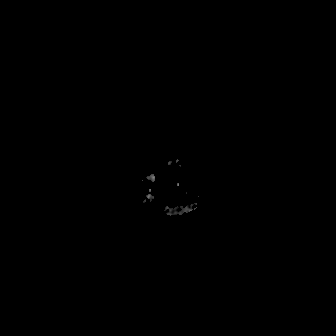

[Series 9: T1 · sagittal · 5.0mm · 0.62mm/px · 1 of 21 slices shown (1 of 2)]
[im 1/21]
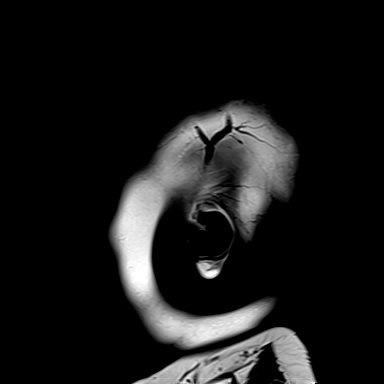

[Series 10: FLAIR · sagittal · 5.0mm · 0.94mm/px · 1 of 21 slices shown (1 of 2)]
[im 1/21]
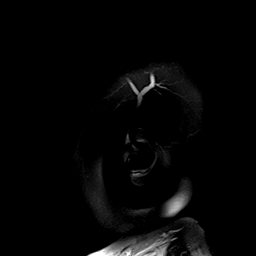

[Series 11: T2 · axial · 5.0mm · 0.57mm/px · z∈[-81,+74]mm · 2 of 27 slices shown]
[im 1/27]
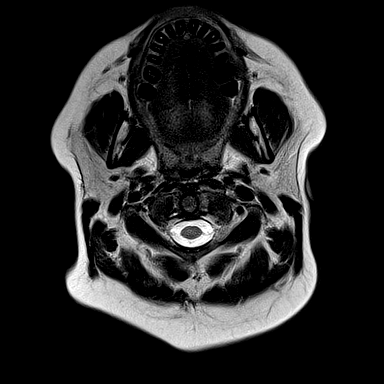
[im 27/27]
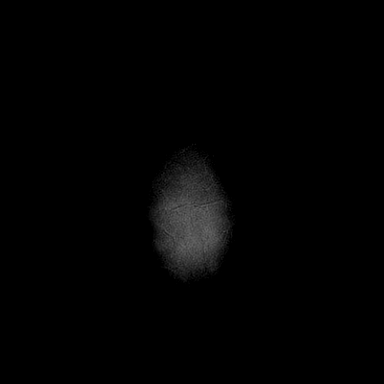

[Series 13: pha_images · axial · 3.0mm · 0.90mm/px · z∈[-92,+81]mm · 3 of 59 slices shown]
[im 1/59]
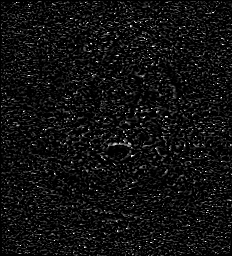
[im 30/59]
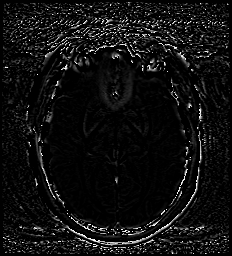
[im 59/59]
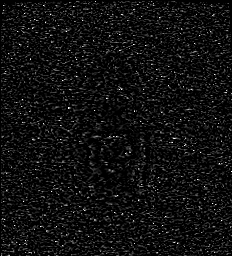

[Series 14: swi_images · axial · 3.0mm · 0.90mm/px · z∈[-92,+84]mm · 3 of 60 slices shown]
[im 1/60]
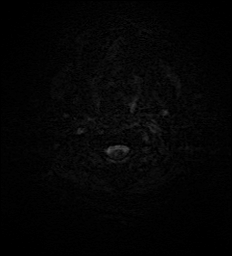
[im 30/60]
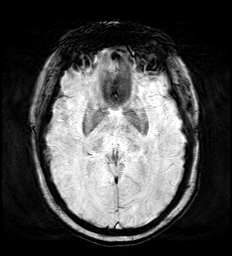
[im 60/60]
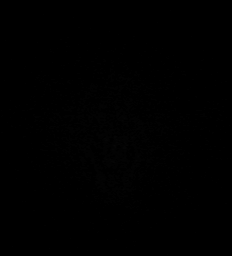

[Series 16: FLAIR · axial · 3.0mm · 0.53mm/px · z∈[-84,+77]mm · 3 of 55 slices shown (2 of 2)]
[im 1/55]
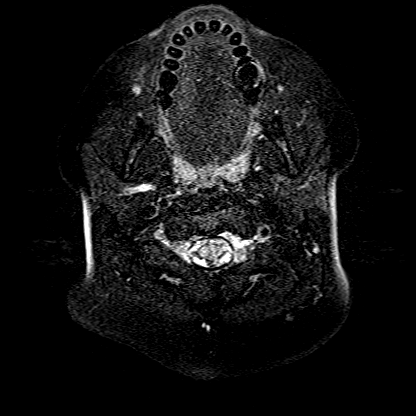
[im 28/55]
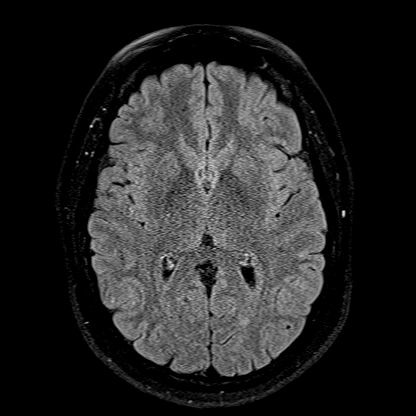
[im 55/55]
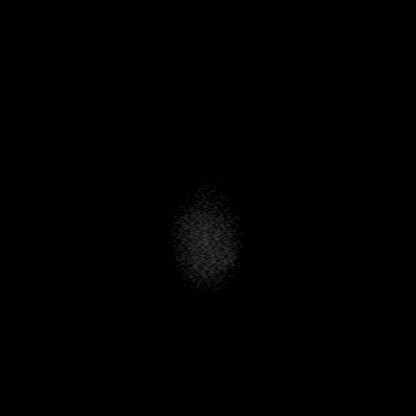

[Series 17: T1 · axial · 1.0mm · 0.98mm/px · z∈[-83,+88]mm · 8 of 171 slices shown (2 of 2)]
[im 1/171]
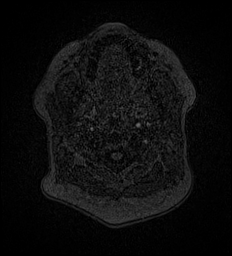
[im 19/171]
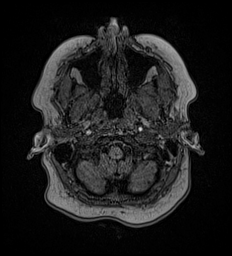
[im 57/171]
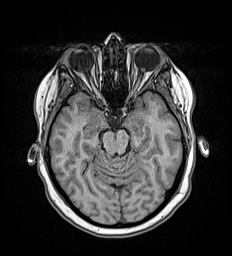
[im 76/171]
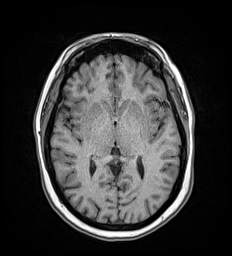
[im 95/171]
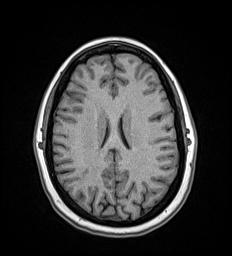
[im 114/171]
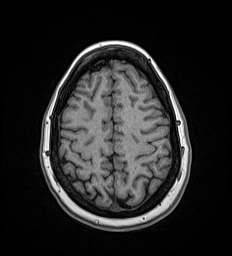
[im 152/171]
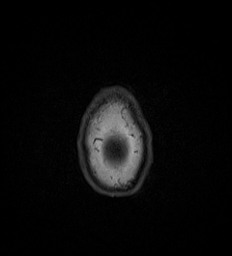
[im 171/171]
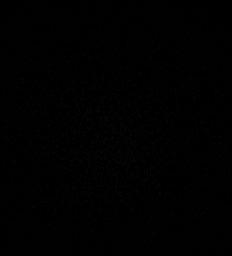

[Series 18: T2 post-contrast · coronal · 5.0mm · 0.62mm/px · 2 of 29 slices shown]
[im 1/29]
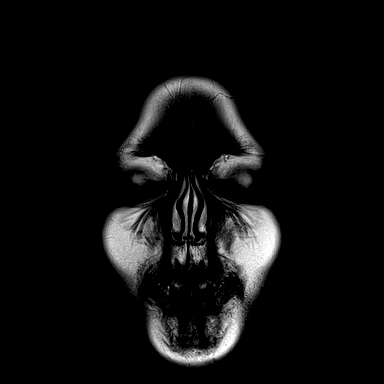
[im 29/29]
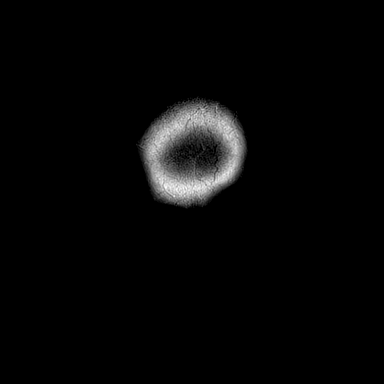

[Series 19: T1 post-contrast · axial · 1.0mm · 0.98mm/px · z∈[-83,+90]mm · 10 of 172 slices shown (1 of 3)]
[im 1/172]
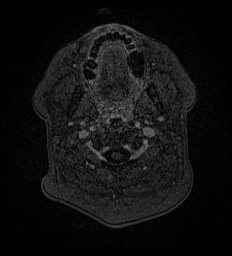
[im 20/172]
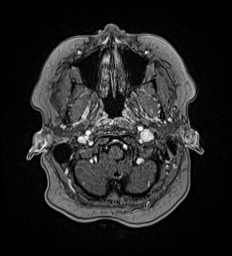
[im 39/172]
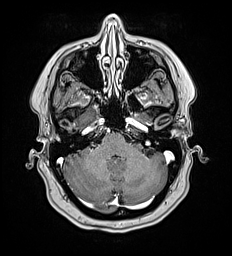
[im 58/172]
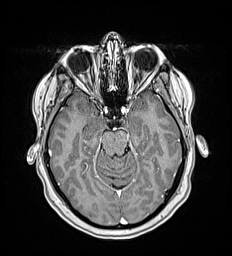
[im 77/172]
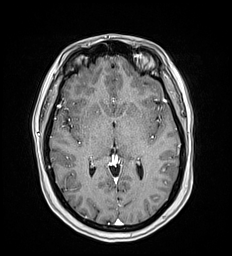
[im 96/172]
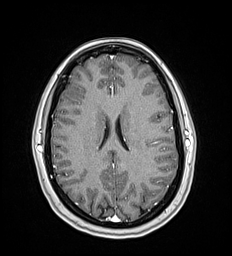
[im 115/172]
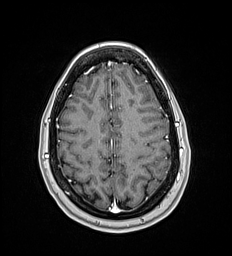
[im 134/172]
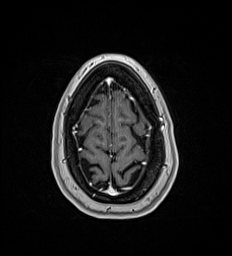
[im 153/172]
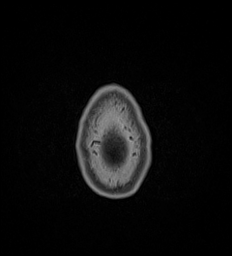
[im 172/172]
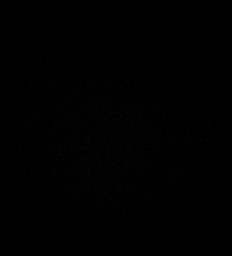

[Series 20: T1 post-contrast · coronal · 5.0mm · 0.62mm/px · 2 of 29 slices shown (2 of 3)]
[im 1/29]
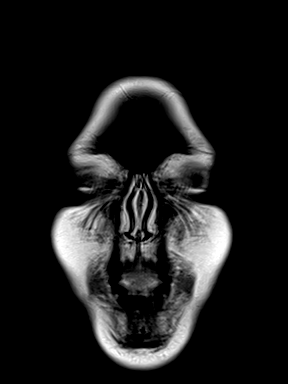
[im 29/29]
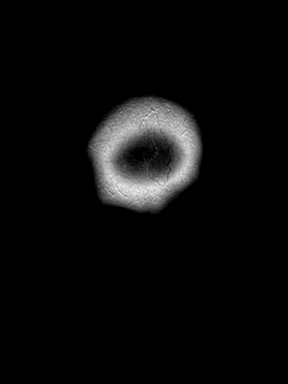

[Series 21: T1 post-contrast · sagittal · 5.0mm · 0.62mm/px · 1 of 21 slices shown (3 of 3)]
[im 1/21]
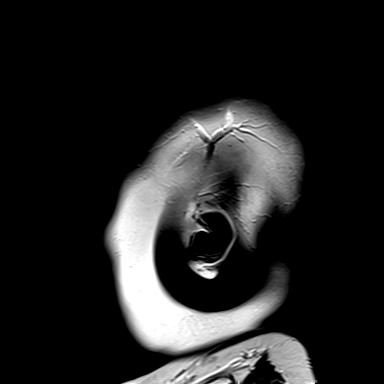

[46 of 48 positions shown; findings below may reference images not displayed]

FINDINGS: Brain:

Intermittently motion degraded examination. This includes mild
motion degradation of the postcontrast T1 weighted imaging.

There is no evidence of acute infarct.

No evidence of intracranial mass.

No midline shift or extra-axial fluid collection.

No chronic intracranial blood products.

There is a 4 mm focus of T2/FLAIR hyperintensity within the
subcortical white matter of the right precentral gyrus (series 16,
image 43). There is an additional 4 mm focus of T2/FLAIR
hyperintensity and corresponding enhancement within the subcortical
white matter of the lateral left postcentral gyrus (series 16, image
37) (series 19, image 105). No other definite focal parenchymal
signal abnormality or abnormal intracranial enhancement is
identified.

Cerebral volume is normal for age

Vascular: Flow voids maintained within the proximal large arterial
vessels. Expected enhancement within the proximal large arterial
vessels and dural venous sinuses.

Skull and upper cervical spine: No focal marrow lesion. There is an
8 mm T2/FLAIR hyperintense lesion within the dorsal spinal cord at
the C4 level (series 10, image 11). Suggestion of subtle
corresponding enhancement at this site (series 21, image 11).

Sinuses/Orbits: Visualized orbits demonstrate no acute abnormality.
Minimal ethmoid sinus mucosal thickening. No significant mastoid
effusion.

These results will be called to the ordering clinician or
representative by the Radiologist Assistant, and communication
documented in the PACS or zVision Dashboard.
IMPRESSION: Subcentimeter foci of T2 hyperintense signal abnormality within the
subcortical white matter of the right precentral gyrus and left
postcentral gyrus. The left post central gyrus focus demonstrates
corresponding enhancement. Additionally, there is an 8 mm lesion
within the posterior aspect of the spinal cord at the C4 level with
probable subtle corresponding enhancement. This constellation of
findings is concerning for a demyelinating process such as multiple
sclerosis with active demyelination. Dedicated contrast-enhanced
cervical spine MRI is recommended for further evaluation.

## 2019-05-02 MED ORDER — GADOBUTROL 1 MMOL/ML IV SOLN
10.0000 mL | Freq: Once | INTRAVENOUS | Status: AC | PRN
  Administered 2019-05-02: 10 mL via INTRAVENOUS

## 2019-05-03 ENCOUNTER — Emergency Department (HOSPITAL_COMMUNITY): Payer: BC Managed Care – PPO

## 2019-05-03 ENCOUNTER — Emergency Department (HOSPITAL_COMMUNITY)
Admission: EM | Admit: 2019-05-03 | Discharge: 2019-05-03 | Disposition: A | Discharge status: Home / Self Care | Payer: BC Managed Care – PPO | Attending: Emergency Medicine | Admitting: Emergency Medicine

## 2019-05-03 ENCOUNTER — Encounter (HOSPITAL_COMMUNITY): Payer: Self-pay

## 2019-05-03 ENCOUNTER — Other Ambulatory Visit: Payer: Self-pay

## 2019-05-03 DIAGNOSIS — R202 Paresthesia of skin: Secondary | ICD-10-CM | POA: Diagnosis present

## 2019-05-03 DIAGNOSIS — G379 Demyelinating disease of central nervous system, unspecified: Secondary | ICD-10-CM | POA: Diagnosis not present

## 2019-05-03 LAB — CBC WITH DIFFERENTIAL/PLATELET
Abs Immature Granulocytes: 0.02 10*3/uL (ref 0.00–0.07)
Basophils Absolute: 0.1 10*3/uL (ref 0.0–0.1)
Basophils Relative: 1 %
Eosinophils Absolute: 0.1 10*3/uL (ref 0.0–0.5)
Eosinophils Relative: 1 %
HCT: 36 % (ref 36.0–46.0)
Hemoglobin: 12 g/dL (ref 12.0–15.0)
Immature Granulocytes: 0 %
Lymphocytes Relative: 30 %
Lymphs Abs: 2.1 10*3/uL (ref 0.7–4.0)
MCH: 29.6 pg (ref 26.0–34.0)
MCHC: 33.3 g/dL (ref 30.0–36.0)
MCV: 88.9 fL (ref 80.0–100.0)
Monocytes Absolute: 0.4 10*3/uL (ref 0.1–1.0)
Monocytes Relative: 5 %
Neutro Abs: 4.4 10*3/uL (ref 1.7–7.7)
Neutrophils Relative %: 63 %
Platelets: 346 10*3/uL (ref 150–400)
RBC: 4.05 MIL/uL (ref 3.87–5.11)
RDW: 13.4 % (ref 11.5–15.5)
WBC: 7 10*3/uL (ref 4.0–10.5)
nRBC: 0 % (ref 0.0–0.2)

## 2019-05-03 LAB — I-STAT BETA HCG BLOOD, ED (MC, WL, AP ONLY): I-stat hCG, quantitative: <5 m[IU]/mL (ref <5)

## 2019-05-03 LAB — BASIC METABOLIC PANEL
Anion gap: 9 (ref 5–15)
BUN: 6 mg/dL (ref 6–20)
CO2: 23 mmol/L (ref 22–32)
Calcium: 8.7 mg/dL — ABNORMAL LOW (ref 8.9–10.3)
Chloride: 106 mmol/L (ref 98–111)
Creatinine, Ser: 0.7 mg/dL (ref 0.44–1.00)
GFR calc Af Amer: >60 mL/min (ref >60)
GFR calc non Af Amer: >60 mL/min (ref >60)
Glucose, Bld: 91 mg/dL (ref 70–99)
Potassium: 3.8 mmol/L (ref 3.5–5.1)
Sodium: 138 mmol/L (ref 135–145)

## 2019-05-03 IMAGING — MR MR CERVICAL SPINE WO/W CM
5 of 9 series · 19 of 48 positions shown · IV contrast (gadavist)
Comparison: Brain MRI [DATE].

CLINICAL DATA: History of multiple sclerosis. Right-sided numbness.
Tingling in the hands, legs and feet since [DATE].

EXAM:
MRI CERVICAL SPINE WITHOUT AND WITH CONTRAST
TECHNIQUE: Multiplanar and multiecho pulse sequences of the cervical spine, to
include the craniocervical junction and cervicothoracic junction,
were obtained without and with intravenous contrast.
CONTRAST:  10 ML GADAVIST IV

[Series 3: T2 · sagittal · 3.0mm · 0.43mm/px · 3 of 16 slices shown (1 of 2)]
[im 1/16]
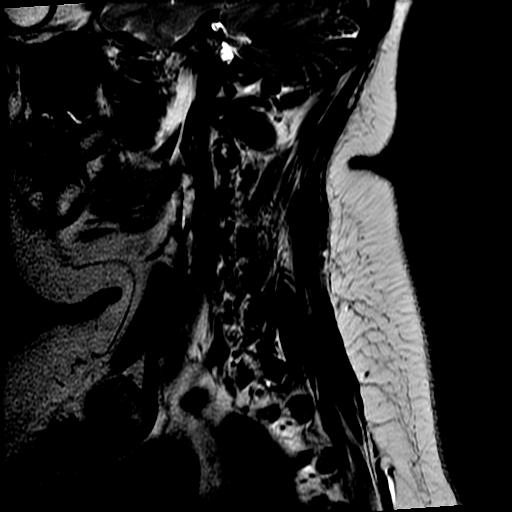
[im 8/16]
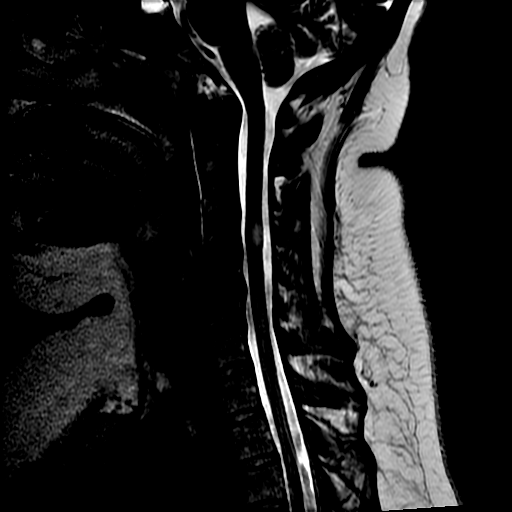
[im 16/16]
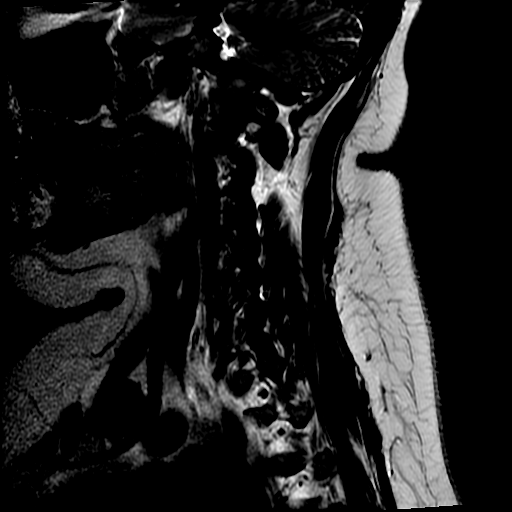

[Series 5: STIR · sagittal · 3.0mm · 0.43mm/px · 3 of 16 slices shown]
[im 1/16]
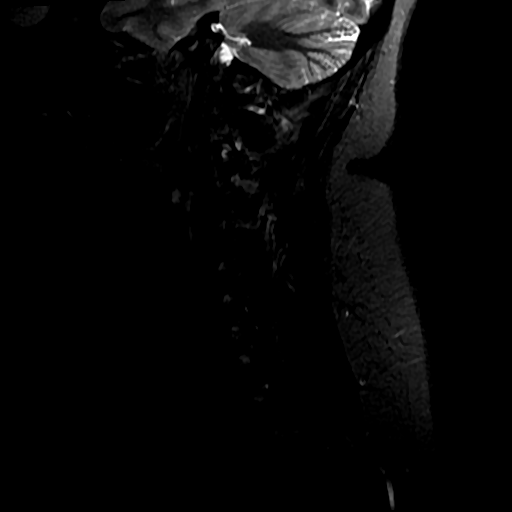
[im 8/16]
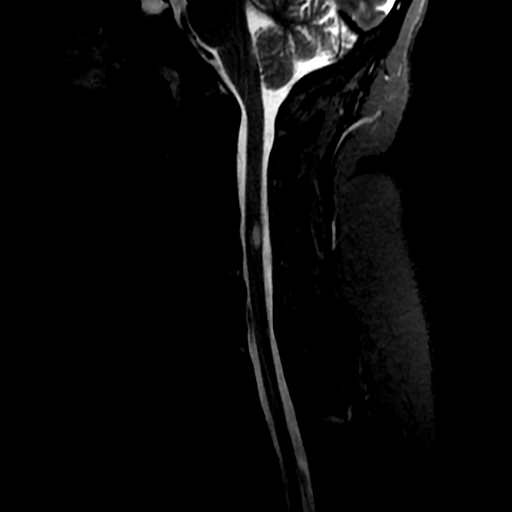
[im 16/16]
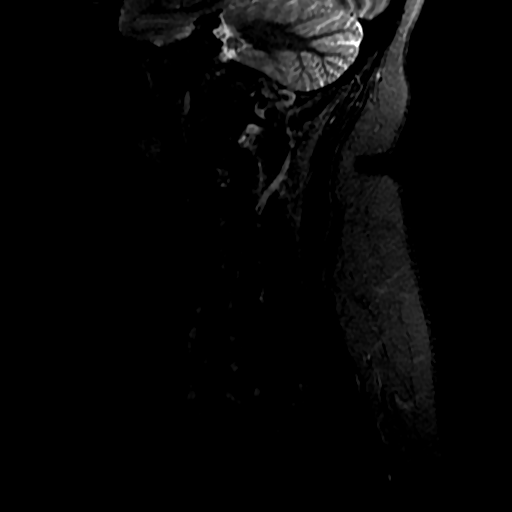

[Series 7: T2 · axial · 3.0mm · 0.35mm/px · z∈[-229,-134]mm · 5 of 30 slices shown (2 of 2)]
[im 1/30]
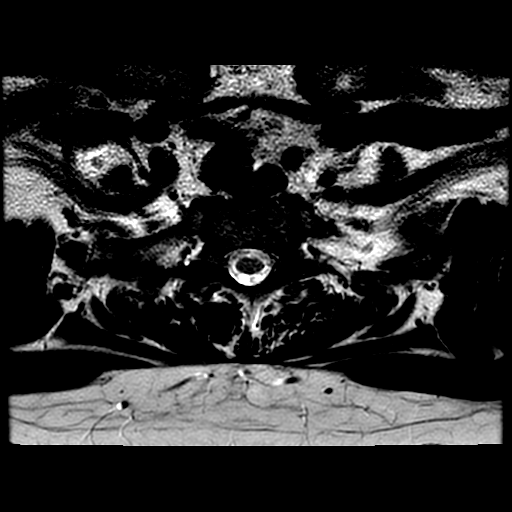
[im 8/30]
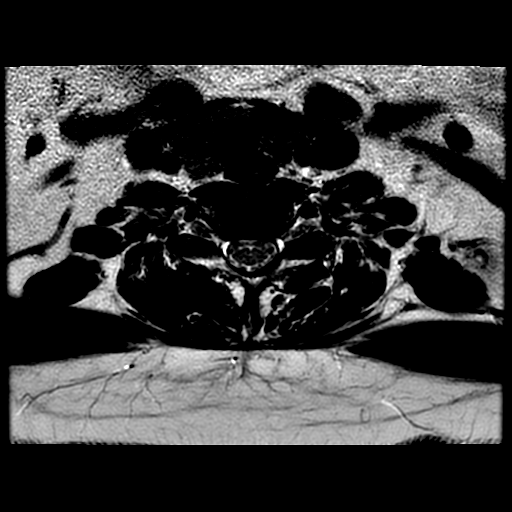
[im 15/30]
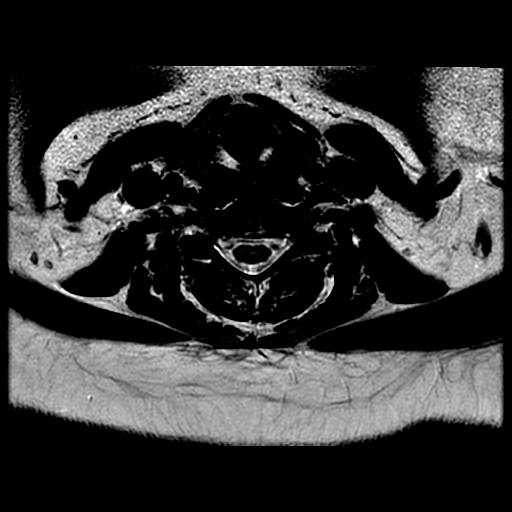
[im 22/30]
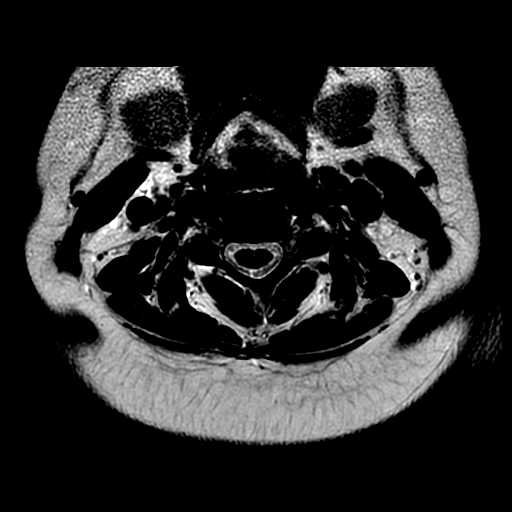
[im 30/30]
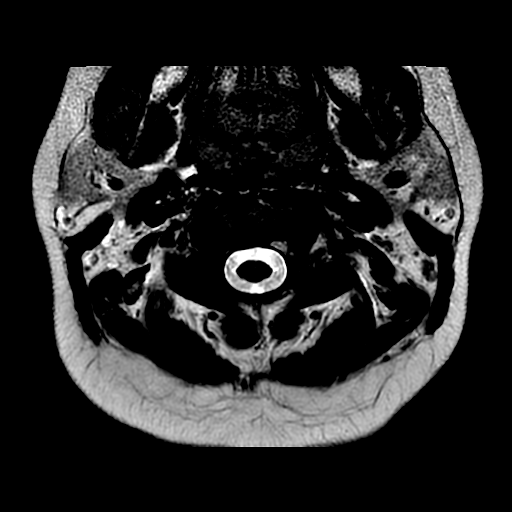

[Series 8: T1 · axial · non-contrast · 3.0mm · 0.35mm/px · z∈[-229,-134]mm · 5 of 30 slices shown]
[im 1/30]
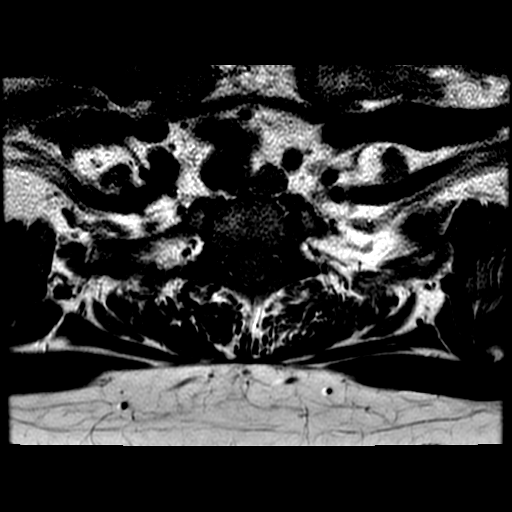
[im 8/30]
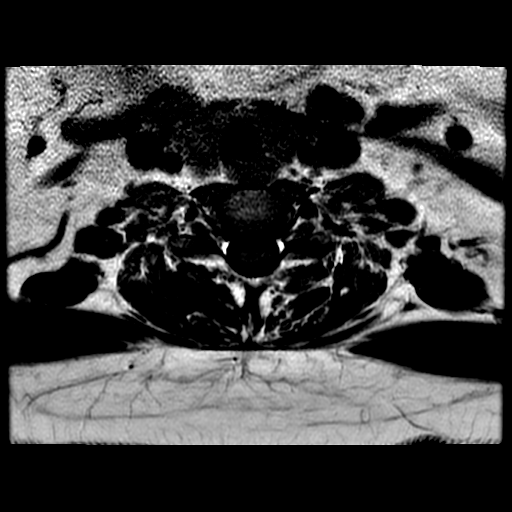
[im 15/30]
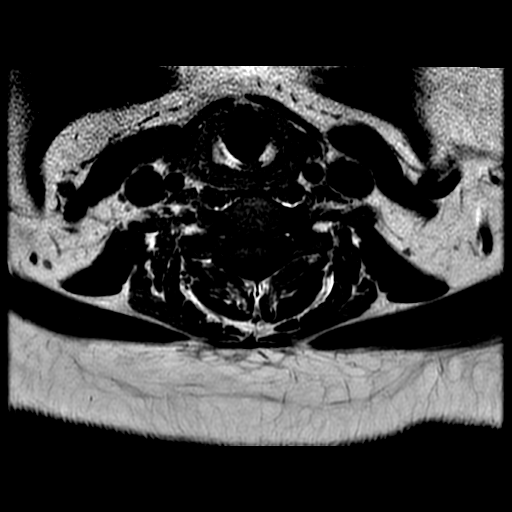
[im 22/30]
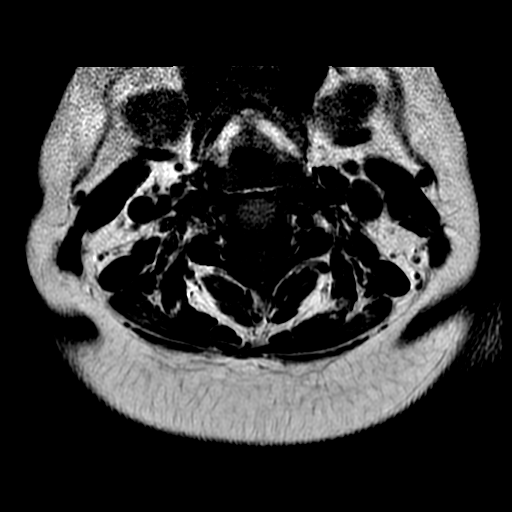
[im 30/30]
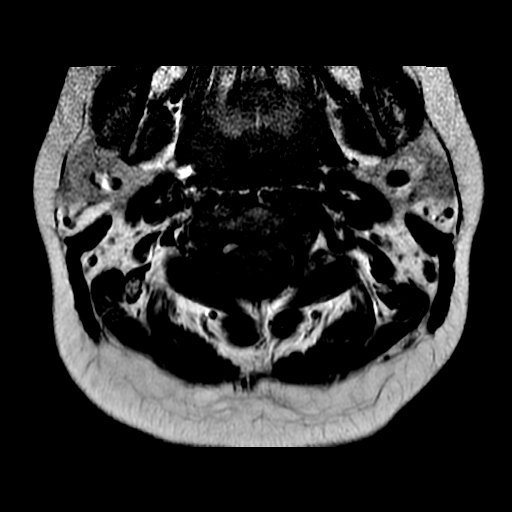

[Series 10: T1 fat-sat post-contrast · sagittal · 3.0mm · 0.43mm/px · 3 of 16 slices shown]
[im 1/16]
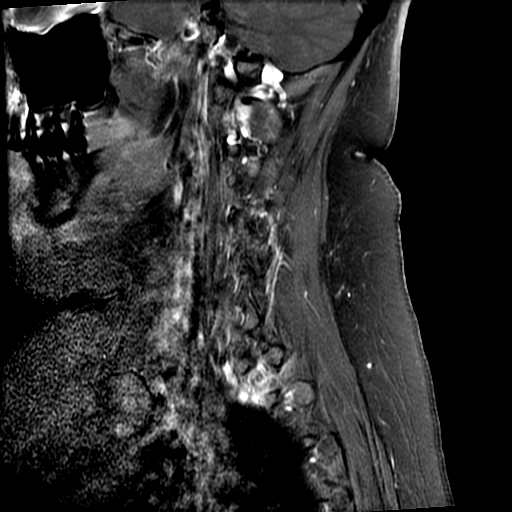
[im 8/16]
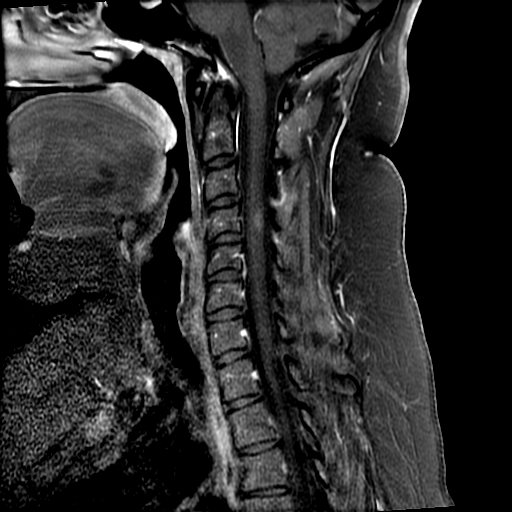
[im 16/16]
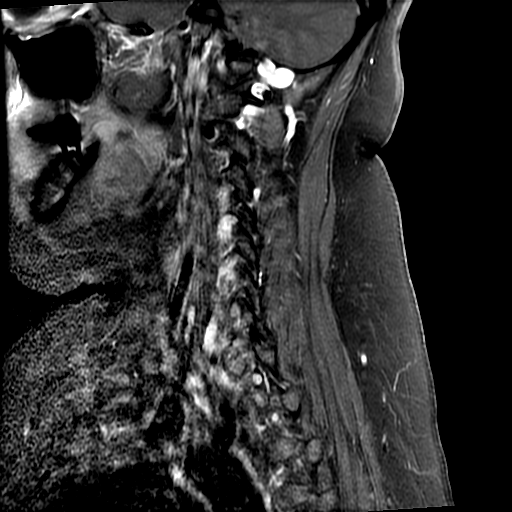

[19 of 48 positions shown; findings below may reference images not displayed]

FINDINGS: Alignment: Normal.

Vertebrae: No fracture, evidence of discitis, or bone lesion.

Cord: As seen on the prior brain MRI, there is an enhancing lesion
within the right hemicord posterior to the C4 vertebral body. The
lesion measures 0.5 cm transverse by 0.5 cm AP by 0.9 cm
craniocaudal. Cord signal is otherwise normal.

Posterior Fossa, vertebral arteries, paraspinal tissues: Negative.

Disc levels:

C2-3: Negative.

C3-4: Negative.

C4-5: Mild uncovertebral disease. No central canal or foraminal
narrowing.

C5-6: Shallow disc bulge without stenosis.

C6-7: Negative.

C7-T1: Negative.
IMPRESSION: Enhancing lesion in the right hemicord posterior to C4 as seen on
prior brain MRI is most worrisome for demyelinating disease such as
multiple sclerosis. Mild enhancement of the lesion is compatible
with active demyelination. No other cord lesions are identified.

Mild cervical spondylosis without stenosis.

## 2019-05-03 MED ORDER — GADOBUTROL 1 MMOL/ML IV SOLN
10.0000 mL | Freq: Once | INTRAVENOUS | Status: AC | PRN
  Administered 2019-05-03: 10 mL via INTRAVENOUS

## 2019-05-03 MED ORDER — PREDNISONE 50 MG PO TABS: 625.0000 mg | ORAL_TABLET | Freq: Every day | ORAL | 0 refills | Status: AC

## 2019-05-03 MED ORDER — SODIUM CHLORIDE 0.9 % IV SOLN
1000.0000 mg | Freq: Once | INTRAVENOUS | Status: AC
  Administered 2019-05-03: 1000 mg via INTRAVENOUS
  Filled 2019-05-03: qty 8

## 2019-05-03 MED ORDER — SODIUM CHLORIDE 0.9 % IV SOLN: 1000.0000 mg | Freq: Once | INTRAVENOUS | Status: DC

## 2019-05-03 NOTE — Consult Note (Addendum)
NEURO HOSPITALIST CONSULT NOTE   Requesting physician: Dr. Jacqulyn Bath   Reason for Consult: right arm and leg tingling   History obtained from:  Patient    HPI:                                                                                                                                          Patricia Compton is an 39 y.o. female  With PMH obesity presented to [facility] ED with c/o of right arm numbness and tingling since the beginning of january and intermittent leg numbness and tingling since October 2020.    Per patient in October 2020 she started having numbness and tingling of her right leg. It tingled from her toes all the way up to her hip. She sa her PCP about this who referred her to podiatry. The symptoms began to get better before she went to the podiatrist where she received some sort of " nerve block injection". Her symptoms completely resolved after the injection about mid - November. Then in January 2021 her leg tingling returned but with right arm numbness that goes all the way up to her shoulder. Her right arms is constantly numb and when she goes to move her right hand to grip anything or touch anything she then has a pins and needles tingling sensation.  She is unable to grip anything with her hands d/t this sensation, but denies any actual weakness. Her PCP referred her to neurology who referred her for an MRI. Currently, the numbness and tingling in her right leg has regressed to just the toes. She denies ever having any facial numbness or tingling. Denies any vision changes, weakness, trouble walking, ETOH, drug use, cigarettes  MRI 05/02/19: Subcentimeter foci of T2 hyperintense signal abnormality within the subcortical white matter of the right precentral gyrus and left postcentral gyrus. The left post central gyrus focus demonstrates corresponding enhancement. Additionally, there is an 8 mm lesion within the posterior aspect of the spinal cord at the C4  level with probable subtle corresponding enhancement  Past Medical History:  Diagnosis Date  . GERD (gastroesophageal reflux disease)   . Morbid obesity (HCC)     Past Surgical History:  Procedure Laterality Date  . CESAREAN SECTION    . CESAREAN SECTION N/A 07/16/2012   Procedure: CESAREAN SECTION;  Surgeon: Catalina Antigua, MD;  Location: [facility];  Service: Obstetrics;  Laterality: N/A;  . CHOLECYSTECTOMY    . TUBAL LIGATION Bilateral 07/16/2012   Procedure: BILATERAL TUBAL LIGATION;  Surgeon: Catalina Antigua, MD;  Location: [facility] OR[facility];  Service: Obstetrics;  Laterality: Bilateral;  . WISDOM TOOTH EXTRACTION      Family History  Problem Relation Age of Onset  . Hypertension Mother   . Cancer Mother  breast  . Breast cancer Mother 75  . COPD Father   . Lung cancer Father   . Cancer Maternal Grandmother   . Breast cancer Maternal Grandmother 60  . Breast cancer Maternal Aunt 67          Social History:  reports that she has never smoked. She has never used smokeless tobacco. She reports that she does not drink alcohol or use drugs.  No Known Allergies  MEDICATIONS:                                                                                                                     No current facility-administered medications for this encounter.   No current outpatient medications on file.      ROS:                                                                                                                                       ROS was performed and is negative except as noted in HPI   Blood pressure (!) 128/112, pulse 92, temperature 98.2 F (36.8 C), temperature source Oral, resp. rate 17, SpO2 100 %.   General Examination:                                                                                                       Physical Exam  Constitutional: Appears well-developed and well-nourished.  Psych: Affect appropriate to situation Eyes: Normal external eye  and conjunctiva. HENT: Normocephalic, no lesions, without obvious abnormality.   Musculoskeletal-no joint tenderness, deformity or swelling Cardiovascular: Normal rate and regular rhythm.  Respiratory: Effort normal, non-labored breathing saturations WNL GI: Soft.  No distension. There is no tenderness.  Skin: WDI  Neurological Examination Mental Status: Alert, oriented, thought content appropriate.  Speech fluent without evidence of aphasia.  Able to follow commands without difficulty. Naming intact Cranial Nerves: II:  Visual fields grossly normal,  III,IV, VI: ptosis not present, extra-ocular motions intact bilaterally pupils equal, round, reactive to  light and accommodation V,VII: smile symmetric, facial light touch sensation normal bilaterally VIII: hearing normal bilaterally IX,X: uvula rises symmetrically XI: bilateral shoulder shrug XII: midline tongue extension Motor: Right hand grip 4-/5   Left hand grip: 5/5 Right : Upper extremity   5/5  Left:     Upper extremity   5/5  Lower extremity   5/5   Lower extremity   5/5 Tone and bulk:normal tone throughout; no atrophy noted Sensory: Pinprick sensation is impaired in her right hand and arm. Vibratory sense intact in RUE and RLE. pinprick, light touch and vibratory sense intact in LUE and LLE Deep Tendon Reflexes: 2+ and symmetric throughout Plantars: Right: downgoing   Left: downgoing Cerebellar: No ataxia  Gait: normal gait and station   Lab Results: No labs to review at this time. Imaging: MR BRAIN W WO CONTRAST  Result Date: 05/02/2019 CLINICAL DATA:  Multiple sclerosis. Additional history provided by technologist: Right-sided numbness: Tingling in hands, legs and feet since October. EXAM: MRI HEAD WITHOUT AND WITH CONTRAST TECHNIQUE: Multiplanar, multiecho pulse sequences of the brain and surrounding structures were obtained without and with intravenous contrast. CONTRAST:  94mL GADAVIST GADOBUTROL 1 MMOL/ML IV SOLN  COMPARISON:  No pertinent prior studies available for comparison. FINDINGS: Brain: Intermittently motion degraded examination. This includes mild motion degradation of the postcontrast T1 weighted imaging. There is no evidence of acute infarct. No evidence of intracranial mass. No midline shift or extra-axial fluid collection. No chronic intracranial blood products. There is a 4 mm focus of T2/FLAIR hyperintensity within the subcortical white matter of the right precentral gyrus (series 16, image 43). There is an additional 4 mm focus of T2/FLAIR hyperintensity and corresponding enhancement within the subcortical white matter of the lateral left postcentral gyrus (series 16, image 37) (series 19, image 105). No other definite focal parenchymal signal abnormality or abnormal intracranial enhancement is identified. Cerebral volume is normal for age Vascular: Flow voids maintained within the proximal large arterial vessels. Expected enhancement within the proximal large arterial vessels and dural venous sinuses. Skull and upper cervical spine: No focal marrow lesion. There is an 8 mm T2/FLAIR hyperintense lesion within the dorsal spinal cord at the C4 level (series 10, image 11). Suggestion of subtle corresponding enhancement at this site (series 21, image 11). Sinuses/Orbits: Visualized orbits demonstrate no acute abnormality. Minimal ethmoid sinus mucosal thickening. No significant mastoid effusion. These results will be called to the ordering clinician or representative by the Radiologist Assistant, and communication documented in the PACS or zVision Dashboard. IMPRESSION: Subcentimeter foci of T2 hyperintense signal abnormality within the subcortical white matter of the right precentral gyrus and left postcentral gyrus. The left post central gyrus focus demonstrates corresponding enhancement. Additionally, there is an 8 mm lesion within the posterior aspect of the spinal cord at the C4 level with probable subtle  corresponding enhancement. This constellation of findings is concerning for a demyelinating process such as multiple sclerosis with active demyelination. Dedicated contrast-enhanced cervical spine MRI is recommended for further evaluation. Electronically Signed   By: Kellie Simmering DO   On: 05/02/2019 20:58    Assessment: With PMH obesity presented to [facility] ED with c/o of right arm numbness and tingling since the beginning of january and intermittent leg numbness and tingling since October 2020. MRI with T2 hyperintense signal abnormality  8 mm lesion at C4. Will obtain C-spine MRI and order 1 G of solumedrol. Continue on solumedrol 625 mg for 2 days and she should f/u with out  patient neurology.  Have ordered labs as follows, ANA, NMO, SSA.    Recommendations: --1g solumedrol now --625 mg  Prednisone daily for 2 days starting 05/04/19  -- labs: Neuromyelitis optica autoab IgG, ANA,SSA --F/u with out patient neurology --C-spine MRI  Valentina Lucks, MSN, NP-C [facility] [phone]  Attending neurologist's note to follow  I have seen the patient and reviewed the above note.  She has 2 juxtacortical lesions 1 of which is enhancing, as well as a spinal lesion which is most consistent with multiple sclerosis.  Given the multiple clinical episodes, she meets McDonald criteria for MS.  I think that neuromyelitis optica would be the major differential here, other autoimmune disease less likely.  She denies any history of optic neuritis.  She has minimal clinical deficits at the current time, and therefore I do not think that she necessarily needs to be admitted.  She could get 3-day course of steroids and I will start with a gram of Solu-Medrol here, and she can do oral prednisone 625 mg,( which is equivalent to 500 mg of Solu-Medrol) for the next 2 days for a total of 3-day course.  There her deficits are minimal, she may benefit from an OT evaluation.  She already has a  neurologist, but she will need to follow-up with them to start disease modifying therapy.  Please call if neurology can be of any further assistance.  Ritta Slot, MD [facility] [phone]  If 7pm- 7am, please page neurology on call as listed in AMION.   05/03/2019, 3:03 PM

## 2019-05-03 NOTE — ED Notes (Signed)
Pt returns from MRI ° °

## 2019-05-03 NOTE — ED Triage Notes (Signed)
Pt currently in MRI dept

## 2019-05-03 NOTE — ED Notes (Signed)
Pt transported to MRI 

## 2019-05-03 NOTE — Discharge Instructions (Addendum)
Start taking the prednisone 625 mg starting tomorrow. Follow-up with your neurologist on Monday. Return to the ED if you start to develop headache, chest pain, numbness in arms or legs that is worsened, trouble walking.

## 2019-05-03 NOTE — ED Provider Notes (Signed)
[facility] DE[facility]MENT Provider Note   CSN: [id number] Arrival date & time: 05/03/19  1342     History No chief complaint on file.   Patricia Compton is a 39 y.o. female with a past medical history of obesity, GERD presenting to the ED at the request of her neurologist for abnormal MRI of the brain. Since October 2020, patient reports tingling in her right foot which radiated up to her right leg.  She was evaluated by her PCP as well as a foot specialist who gave her some sort of injection.  Her symptoms gradually improved over the course of a month.  Since the beginning of January 2021 started having similar paresthesias in her right hand that radiated up to her right arm.  She reports gradually worsening paresthesias and weakness secondary to the paresthesias.  She was evaluated by neurology on 04/25/2019 and an outpatient MRI of the brain was done yesterday which showed possible MS with active demyelination.  She was sent to the ED for further management. She notes that her right lower extremity paresthesias have gradually improved and have not recurred. She denies any headache, facial numbness or droop, vision changes, injuries or falls, back pain, neck pain.  HPI     Past Medical History:  Diagnosis Date   GERD (gastroesophageal reflux disease)    Morbid obesity ([place])     Patient Active Problem List   Diagnosis Date Noted   Family history of breast cancer 10/18/2016   Acid indigestion 02/08/2015   Obesity 07/18/2011   CN (constipation) 03/22/2006    Past Surgical History:  Procedure Laterality Date   CESAREAN SECTION     CESAREAN SECTION N/A 07/16/2012   Procedure: CESAREAN SECTION;  Surgeon: Mora Bellman, MD;  Location: [facility];  Service: Obstetrics;  Laterality: N/A;   CHOLECYSTECTOMY     TUBAL LIGATION Bilateral 07/16/2012   Procedure: BILATERAL TUBAL LIGATION;  Surgeon: Mora Bellman, MD;  Location: [facility];  Service:  Obstetrics;  Laterality: Bilateral;   WISDOM TOOTH EXTRACTION       OB History    Gravida  2   Para  2   Term  2   Preterm      AB      Living  2     SAB      TAB      Ectopic      Multiple      Live Births  2           Family History  Problem Relation Age of Onset   Hypertension Mother    Cancer Mother        breast   Breast cancer Mother 42   COPD Father    Lung cancer Father    Cancer Maternal Grandmother    Breast cancer Maternal Grandmother 60   Breast cancer Maternal Aunt 50    Social History   Tobacco Use   Smoking status: Never Smoker   Smokeless tobacco: Never Used  Substance Use Topics   Alcohol use: No   Drug use: No    Home Medications Prior to Admission medications   Medication Sig Start Date End Date Taking? Authorizing Provider  predniSONE (DELTASONE) 50 MG tablet Take 12.5 tablets (625 mg total) by mouth daily for 2 days. 05/03/19 05/05/19  Delia Heady, PA-C    Allergies    Patient has no known allergies.  Review of Systems   Review of Systems  Constitutional: Negative for  appetite change, chills and fever.  HENT: Negative for ear pain, rhinorrhea, sneezing and sore throat.   Eyes: Negative for photophobia and visual disturbance.  Respiratory: Negative for cough, chest tightness, shortness of breath and wheezing.   Cardiovascular: Negative for chest pain and palpitations.  Gastrointestinal: Negative for abdominal pain, blood in stool, constipation, diarrhea, nausea and vomiting.  Genitourinary: Negative for dysuria, hematuria and urgency.  Musculoskeletal: Negative for myalgias.  Skin: Negative for rash.  Neurological: Positive for weakness and numbness. Negative for dizziness and light-headedness.    Physical Exam Updated Vital Signs BP 115/71 (BP Location: Right Arm)    Pulse 92    Temp 98.2 F (36.8 C) (Oral)    Resp 17    SpO2 100%   Physical Exam Vitals and nursing note reviewed.  Constitutional:        General: She is not in acute distress.    Appearance: She is well-developed.  HENT:     Head: Normocephalic and atraumatic.     Nose: Nose normal.  Eyes:     General: No scleral icterus.       Right eye: No discharge.        Left eye: No discharge.     Conjunctiva/sclera: Conjunctivae normal.     Pupils: Pupils are equal, round, and reactive to light.  Cardiovascular:     Rate and Rhythm: Normal rate and regular rhythm.     Heart sounds: Normal heart sounds. No murmur. No friction rub. No gallop.   Pulmonary:     Effort: Pulmonary effort is normal. No respiratory distress.     Breath sounds: Normal breath sounds.  Abdominal:     General: Bowel sounds are normal. There is no distension.     Palpations: Abdomen is soft.     Tenderness: There is no abdominal tenderness. There is no guarding.  Musculoskeletal:        General: Normal range of motion.     Cervical back: Normal range of motion and neck supple.  Skin:    General: Skin is warm and dry.     Findings: No rash.  Neurological:     Mental Status: She is alert and oriented to person, place, and time.     Cranial Nerves: No cranial nerve deficit.     Sensory: No sensory deficit.     Motor: Weakness present. No abnormal muscle tone.     Coordination: Coordination normal.     Comments: Patient with decreased grip strength of RUE. Reports "different" sensation to light touch of RUE compared to L. No overlying skin changes noted.  2+ radial pulse palpated bilaterally.  Full active and passive range of motion of right arm and digits without difficulty.  No facial asymmetry or changes sensation noted. Strength 5/5 in BLE.     ED Results / Procedures / Treatments   Labs (all labs ordered are listed, but only abnormal results are displayed) Labs Reviewed  CBC WITH DIFFERENTIAL/PLATELET  BASIC METABOLIC PANEL  SJOGRENS SYNDROME-A EXTRACTABLE NUCLEAR ANTIBODY  SJOGRENS SYNDROME-B EXTRACTABLE NUCLEAR ANTIBODY  NEUROMYELITIS  OPTICA AUTOAB, IGG  ANA W/REFLEX IF POSITIVE  I-STAT BETA HCG BLOOD, ED ([facility], [facility], [facility] ONLY)    EKG None  Radiology MR BRAIN W WO CONTRAST  Result Date: 05/02/2019 CLINICAL DATA:  Multiple sclerosis. Additional history provided by technologist: Right-sided numbness: Tingling in hands, legs and feet since October. EXAM: MRI HEAD WITHOUT AND WITH CONTRAST TECHNIQUE: Multiplanar, multiecho pulse sequences of the brain and surrounding structures  were obtained without and with intravenous contrast. CONTRAST:  57mL GADAVIST GADOBUTROL 1 MMOL/ML IV SOLN COMPARISON:  No pertinent prior studies available for comparison. FINDINGS: Brain: Intermittently motion degraded examination. This includes mild motion degradation of the postcontrast T1 weighted imaging. There is no evidence of acute infarct. No evidence of intracranial mass. No midline shift or extra-axial fluid collection. No chronic intracranial blood products. There is a 4 mm focus of T2/FLAIR hyperintensity within the subcortical white matter of the right precentral gyrus (series 16, image 43). There is an additional 4 mm focus of T2/FLAIR hyperintensity and corresponding enhancement within the subcortical white matter of the lateral left postcentral gyrus (series 16, image 37) (series 19, image 105). No other definite focal parenchymal signal abnormality or abnormal intracranial enhancement is identified. Cerebral volume is normal for age Vascular: Flow voids maintained within the proximal large arterial vessels. Expected enhancement within the proximal large arterial vessels and dural venous sinuses. Skull and upper cervical spine: No focal marrow lesion. There is an 8 mm T2/FLAIR hyperintense lesion within the dorsal spinal cord at the C4 level (series 10, image 11). Suggestion of subtle corresponding enhancement at this site (series 21, image 11). Sinuses/Orbits: Visualized orbits demonstrate no acute abnormality. Minimal ethmoid sinus mucosal thickening.  No significant mastoid effusion. These results will be called to the ordering clinician or representative by the Radiologist Assistant, and communication documented in the PACS or zVision Dashboard. IMPRESSION: Subcentimeter foci of T2 hyperintense signal abnormality within the subcortical white matter of the right precentral gyrus and left postcentral gyrus. The left post central gyrus focus demonstrates corresponding enhancement. Additionally, there is an 8 mm lesion within the posterior aspect of the spinal cord at the C4 level with probable subtle corresponding enhancement. This constellation of findings is concerning for a demyelinating process such as multiple sclerosis with active demyelination. Dedicated contrast-enhanced cervical spine MRI is recommended for further evaluation. Electronically Signed   By: Jackey Loge DO   On: 05/02/2019 20:58   MR Cervical Spine W or Wo Contrast  Result Date: 05/03/2019 CLINICAL DATA:  History of multiple sclerosis. Right-sided numbness. Tingling in the hands, legs and feet since October, 2020. EXAM: MRI CERVICAL SPINE WITHOUT AND WITH CONTRAST TECHNIQUE: Multiplanar and multiecho pulse sequences of the cervical spine, to include the craniocervical junction and cervicothoracic junction, were obtained without and with intravenous contrast. CONTRAST:  10 ML GADAVIST IV COMPARISON:  Brain MRI 05/02/2019. FINDINGS: Alignment: Normal. Vertebrae: No fracture, evidence of discitis, or bone lesion. Cord: As seen on the prior brain MRI, there is an enhancing lesion within the right hemicord posterior to the C4 vertebral body. The lesion measures 0.5 cm transverse by 0.5 cm AP by 0.9 cm craniocaudal. Cord signal is otherwise normal. Posterior Fossa, vertebral arteries, paraspinal tissues: Negative. Disc levels: C2-3: Negative. C3-4: Negative. C4-5: Mild uncovertebral disease. No central canal or foraminal narrowing. C5-6: Shallow disc bulge without stenosis. C6-7: Negative.  C7-T1: Negative. IMPRESSION: Enhancing lesion in the right hemicord posterior to C4 as seen on prior brain MRI is most worrisome for demyelinating disease such as multiple sclerosis. Mild enhancement of the lesion is compatible with active demyelination. No other cord lesions are identified. Mild cervical spondylosis without stenosis. Electronically Signed   By: Drusilla Kanner M.D.   On: 05/03/2019 17:56    Procedures Procedures (including critical care time)  Medications Ordered in ED Medications  methylPREDNISolone sodium succinate (SOLU-MEDROL) 1,000 mg in sodium chloride 0.9 % 50 mL IVPB (has no administration in  time range)  gadobutrol (GADAVIST) 1 MMOL/ML injection 10 mL (10 mLs Intravenous Contrast Given 05/03/19 1727)    ED Course  I have reviewed the triage vital signs and the nursing notes.  Pertinent labs & imaging results that were available during my care of the patient were reviewed by me and considered in my medical decision making (see chart for details).    MDM Rules/Calculators/A&P                      39 year old female presenting to the ED with a chief complaint of right arm tingling.  Symptoms have been going on since early January 2021.  She had similar symptoms in her right lower extremity in October 2020 which gradually resolved without intervention.  She was seen and evaluated at neurologist office 1 week ago, had an outpatient MRI of the brain yesterday which showed active demyelination with concern for MS.  I will consult Dr. Amada Jupiter of neurology for further recommendations.  Dr. Amada Jupiter recommends IV Solu-Medrol and discharged home with prednisone most likely, based on her presentation which is concerning for MS.  I have ordered an MRI of her cervical spine as well as baseline lab work and other lab work ordered by Dr. Amada Jupiter.  MRI of the cervical spine shows further diagnosis of demyelinating disease. Will discharge home with 625mg  of prednisone  beginning tomorrow after 1 g of Solu-Medrol given here.  Care handed off to oncoming provider pending labwork. I have ordered an OT consult for her.  Final Clinical Impression(s) / ED Diagnoses Final diagnoses:  Demyelinating disease of central nervous system [facility])    Rx / DC Orders ED Discharge Orders         Ordered    predniSONE (DELTASONE) 50 MG tablet  Daily     05/03/19 1828           05/05/19, PA-C 05/03/19 05/05/19, MD 05/03/19 1914

## 2019-05-03 NOTE — ED Triage Notes (Signed)
Patient complains of right arm and hand tingling since early January. Had MRI yesterday and was directed to come to ED for further treatment. Patient alert and oriented, only complains of right upper extremity tingling

## 2019-05-03 NOTE — ED Notes (Signed)
PIVC removed with intact catheter tip.  

## 2019-05-03 NOTE — ED Triage Notes (Signed)
PT currently in MRI dept

## 2019-05-06 LAB — SJOGRENS SYNDROME-B EXTRACTABLE NUCLEAR ANTIBODY

## 2019-05-06 LAB — NEUROMYELITIS OPTICA AUTOAB, IGG

## 2019-05-06 LAB — SJOGRENS SYNDROME-A EXTRACTABLE NUCLEAR ANTIBODY

## 2019-05-06 LAB — ANA W/REFLEX IF POSITIVE

## 2019-05-12 ENCOUNTER — Other Ambulatory Visit: Payer: Self-pay | Admitting: Neurology

## 2019-05-12 DIAGNOSIS — G35 Multiple sclerosis: Secondary | ICD-10-CM

## 2019-05-16 ENCOUNTER — Other Ambulatory Visit: Payer: Self-pay

## 2019-05-16 ENCOUNTER — Ambulatory Visit
Admission: RE | Admit: 2019-05-16 | Discharge: 2019-05-16 | Disposition: A | Discharge status: Home / Self Care | Payer: BC Managed Care – PPO | Source: Ambulatory Visit | Attending: Neurology | Admitting: Neurology

## 2019-05-16 DIAGNOSIS — G35 Multiple sclerosis: Secondary | ICD-10-CM | POA: Diagnosis present

## 2019-05-16 LAB — ALBUMIN: Albumin: 3.8 g/dL (ref 3.5–5.0)

## 2019-05-16 LAB — CSF CELL COUNT WITH DIFFERENTIAL
Eosinophils, CSF: 0 %
Eosinophils, CSF: 0 %
Lymphs, CSF: 95 %
Lymphs, CSF: 98 %
Monocyte-Macrophage-Spinal Fluid: 2 %
Monocyte-Macrophage-Spinal Fluid: 4 %
RBC Count, CSF: 3 /mm3 (ref 0–3)
RBC Count, CSF: 3 /mm3 (ref 0–3)
Segmented Neutrophils-CSF: 0 %
Segmented Neutrophils-CSF: 1 %
Tube #: 1
Tube #: 4
WBC, CSF: 14 /mm3 (ref 0–5)
WBC, CSF: 19 /mm3 (ref 0–5)

## 2019-05-16 LAB — CBC WITH DIFFERENTIAL/PLATELET
Abs Immature Granulocytes: 0.03 10*3/uL (ref 0.00–0.07)
Basophils Absolute: 0.1 10*3/uL (ref 0.0–0.1)
Basophils Relative: 1 %
Eosinophils Absolute: 0.1 10*3/uL (ref 0.0–0.5)
Eosinophils Relative: 1 %
HCT: 39.1 % (ref 36.0–46.0)
Hemoglobin: 12.7 g/dL (ref 12.0–15.0)
Immature Granulocytes: 0 %
Lymphocytes Relative: 31 %
Lymphs Abs: 2.1 10*3/uL (ref 0.7–4.0)
MCH: 29.2 pg (ref 26.0–34.0)
MCHC: 32.5 g/dL (ref 30.0–36.0)
MCV: 89.9 fL (ref 80.0–100.0)
Monocytes Absolute: 0.4 10*3/uL (ref 0.1–1.0)
Monocytes Relative: 5 %
Neutro Abs: 4.2 10*3/uL (ref 1.7–7.7)
Neutrophils Relative %: 62 %
Platelets: 350 10*3/uL (ref 150–400)
RBC: 4.35 MIL/uL (ref 3.87–5.11)
RDW: 14.1 % (ref 11.5–15.5)
WBC: 6.9 10*3/uL (ref 4.0–10.5)
nRBC: 0 % (ref 0.0–0.2)

## 2019-05-16 LAB — PROTIME-INR
INR: 0.9 (ref 0.8–1.2)
Prothrombin Time: 12.4 seconds (ref 11.4–15.2)

## 2019-05-16 LAB — APTT: aPTT: 29 seconds (ref 24–36)

## 2019-05-16 LAB — POCT PREGNANCY, URINE: Preg Test, Ur: NEGATIVE

## 2019-05-16 LAB — PROTEIN, CSF: Total  Protein, CSF: 30 mg/dL (ref 15–45)

## 2019-05-16 LAB — GLUCOSE, CSF: Glucose, CSF: 50 mg/dL (ref 40–70)

## 2019-05-16 LAB — GLUCOSE, RANDOM: Glucose, Bld: 89 mg/dL (ref 70–99)

## 2019-05-16 IMAGING — RF DG FLUORO GUIDE SPINAL/SI JT INJ*L*
1 series · 2 of 2 positions shown · non-contrast
Comparison: MRI of the head and cervical spine [DATE].

CLINICAL DATA: MS.

EXAM:
DIAGNOSTIC LUMBAR PUNCTURE UNDER FLUOROSCOPIC GUIDANCE

[Series 1: fluoro_iodine 2fps_bw · 0.17mm/px · 2 of 2 frames shown]
[frame 1/2]
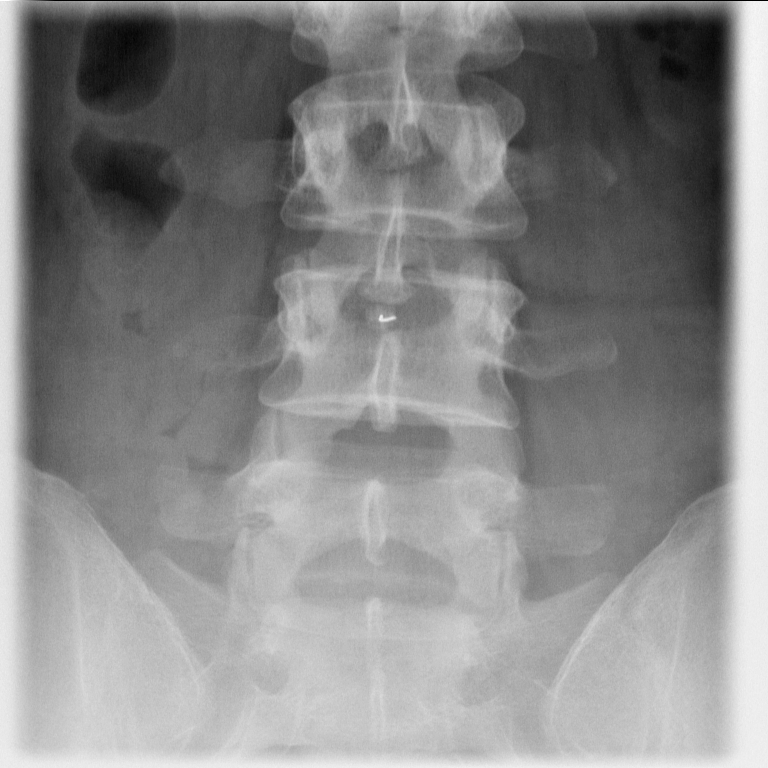
[frame 2/2]
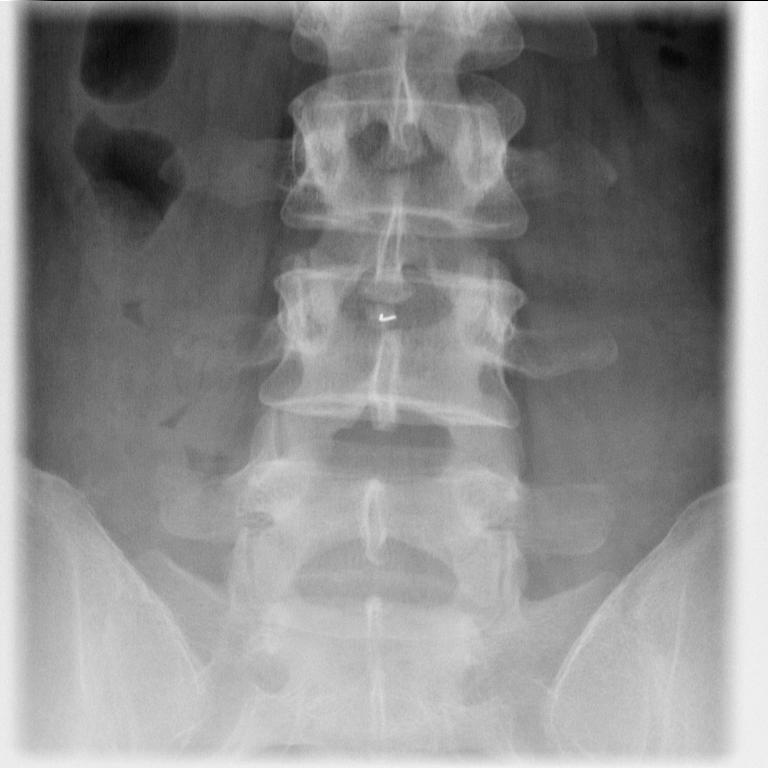

[2 of 2 positions shown; findings below may reference images not displayed]

CONTRAST:  None.

FLUOROSCOPY TIME:  Fluoroscopy Time:  1 minutes 0 seconds

Radiation Exposure Index (if provided by the fluoroscopic device):
35.7 mGy

Number of Acquired Spot Images: 2

PROCEDURE:
Informed consent was obtained from the patient prior to the
procedure, including potential complications of headache, allergy,
and pain. With the patient prone, the lower back was prepped with
Betadine. 1% Lidocaine was used for local anesthesia. Lumbar
puncture was performed at the L3-L4 level using a 22 gauge needle
with return of clear CSF. Nine ml of CSF were obtained for
laboratory studies. The patient tolerated the procedure well and
there were no apparent complications.
IMPRESSION: Successful fluoroscopically directed lumbar puncture.

## 2019-05-16 MED ORDER — ACETAMINOPHEN 325 MG PO TABS
ORAL_TABLET | ORAL | Status: AC
  Filled 2019-05-16: qty 2

## 2019-05-16 MED ORDER — ACETAMINOPHEN 325 MG PO TABS
650.0000 mg | ORAL_TABLET | ORAL | Status: DC | PRN
  Administered 2019-05-16: 11:00:00 650 mg via ORAL
  Filled 2019-05-16 (×2): qty 2

## 2019-05-16 NOTE — Progress Notes (Signed)
Lab results phoned by nursing to pt's MD to ok D/C.

## 2019-05-16 NOTE — Progress Notes (Signed)
Patient with critical white cell count called to Dr. Geralyn Flash office; Per Toni Amend she will contact Dr. Malvin Johns with the result.  Patient is to be discharged at 12:15 per Dr. Ginnie Smart order.  Patient without complaint at this time. Will follow up with Dr. Malvin Johns as instructed.

## 2019-05-16 NOTE — Discharge Instructions (Signed)
Lumbar Puncture, Care After This sheet gives you information about how to care for yourself after your procedure. Your health care provider may also give you more specific instructions. If you have problems or questions, contact your health care provider. What can I expect after the procedure? After the procedure, it is common to have:  Mild discomfort or pain at the puncture site.  A mild headache that is relieved with pain medicines. Follow these instructions at home: Activity   Lie down flat or rest for as long as directed by your health care provider.  Return to your normal activities as told by your health care provider. Ask your health care provider what activities are safe for you.  Avoid lifting anything heavier than 10 lb (4.5 kg) for at least 12 hours after the procedure.  Do not drive for 24 hours if you were given a medicine to help you relax (sedative) during your procedure.  Do not drive or use heavy machinery while taking prescription pain medicine. Puncture site care  Remove or change your bandage (dressing) as told by your health care provider.  Check your puncture area every day for signs of infection. Check for: ? More pain. ? Redness or swelling. ? Fluid or blood leaking from the puncture site. ? Warmth. ? Pus or a bad smell. General instructions  Take over-the-counter and prescription medicines only as told by your health care provider.  Drink enough fluids to keep your urine clear or pale yellow. Your health care provider may recommend drinking caffeine to prevent a headache.  Keep all follow-up visits as told by your health care provider. This is important. Contact a health care provider if:  You have fever or chills.  You have nausea or vomiting.  You have a headache that lasts for more than 2 days or does not get better with medicine. Get help right away if:  You develop any of the following in your  legs: ? Weakness. ? Numbness. ? Tingling.  You are unable to control when you urinate or have a bowel movement (incontinence).  You have signs of infection around your puncture site, such as: ? More pain. ? Redness or swelling. ? Fluid or blood leakage. ? Warmth. ? Pus or a bad smell.  You are dizzy or you feel like you might faint.  You have a severe headache, especially when you sit or stand. Summary  A lumbar puncture is a procedure in which a small needle is inserted into the lower back to remove fluid that surrounds the brain and spinal cord.  After this procedure, it is common to have a headache and pain around the needle insertion area.  Lying flat, staying hydrated, and drinking caffeine can help prevent headaches.  Monitor your needle insertion site for signs of infection, including warmth, fluid, or more pain.  Get help right away if you develop leg weakness, leg numbness, incontinence, or severe headaches. This information is not intended to replace advice given to you by your health care provider. Make sure you discuss any questions you have with your health care provider. Document Revised: 05/10/2016 Document Reviewed: 05/10/2016 Elsevier Patient Education  2020 [employer] Inc.  

## 2019-05-16 NOTE — Progress Notes (Signed)
Pt stable after lp.Back stable.D/C instructions given to pt.F/U with her MD.

## 2019-05-19 LAB — CSF CULTURE W GRAM STAIN: Culture: NO GROWTH

## 2019-05-19 LAB — IGG CSF INDEX
Albumin CSF-mCnc: 19 mg/dL (ref 11–48)
Albumin: 3.9 g/dL (ref 3.8–4.8)
CSF IgG Index: 2.4 — ABNORMAL HIGH (ref 0.0–0.7)
IgG (Immunoglobin G), Serum: 869 mg/dL (ref 586–1602)
IgG, CSF: 10 mg/dL — ABNORMAL HIGH (ref 0.0–8.6)
IgG/Alb Ratio, CSF: 0.53 — ABNORMAL HIGH (ref 0.00–0.25)

## 2019-05-20 LAB — OLIGOCLONAL BANDS, CSF + SERM

## 2019-06-16 ENCOUNTER — Ambulatory Visit: Payer: Self-pay | Admitting: *Deleted

## 2019-06-16 NOTE — Telephone Encounter (Signed)
MS doesn't cause any skin rash or lesion. Could be a delayed reaction to the vaccination or a boil starting. Should see it.

## 2019-06-16 NOTE — Telephone Encounter (Signed)
  Reason for Disposition . COVID-19 vaccine, injection site reaction (e.g., pain, redness, swelling), question about  Answer Assessment - Initial Assessment Questions 1. MAIN CONCERN OR SYMPTOM:  "What is your main concern right now?" "What question do you have?" "What's the main symptom you're worried about?" (e.g., fever, pain, redness, swelling)     Redness and warmth at the covid 19 vaccine site 9 days after injection. 2. VACCINE: "What vaccination did you receive?" "Is this your first or second shot?" (e.g., none; Moderna, Pfizer, other)    1st Covid 19 vaccine. 3. SYMPTOM ONSET: "When did the redness begin?" (e.g., not relevant; hours, days)     15 hours ago 4. SYMPTOM SEVERITY: "How bad is it?"      mild 5. FEVER: "Is there a fever?" If so, ask: "What is it, how was it measured, and when did it start?"      No fever. 6. PAST REACTIONS: "Have you reacted to immunizations before?" If so, ask: "What happened?"     No 7. OTHER SYMPTOMS: "Do you have any other symptoms?"     none  Protocols used: CORONAVIRUS (COVID-19) VACCINE QUESTIONS AND REACTIONS-A-AH

## 2019-06-16 NOTE — Telephone Encounter (Signed)
1st Covid 19 vaccine on 2/27 and noticed yesterday at the site of the vaccine in her right upper arm she has redness about half dollar sized and slightly warm to touch.  Denies all other symptoms and has full ROM with the arm and shoulder.  Care advice including ice pack to the site for 15 minutes several times today.  She mentioned she was recently diagnosed with MS and concerned this reaction may have some relation to that diagnosis.  Reviewed with the patient that was unlikely. Routing to provider in case any other advice is warranted. Thank you.

## 2019-06-16 NOTE — Telephone Encounter (Signed)
[facility] Patient got her Covid vaccine on 06/07/19.  Did not have any problem at the site of the injection.  Yesterday she began having redness, some swelling and pain.  Would you consider this just a local reaction even though it is so long after the actual injection

## 2019-06-16 NOTE — Telephone Encounter (Signed)
Patient wishes to wait one more day to see how it does.

## 2019-06-19 ENCOUNTER — Encounter: Payer: Self-pay | Admitting: Physician Assistant

## 2019-07-30 ENCOUNTER — Ambulatory Visit (INDEPENDENT_AMBULATORY_CARE_PROVIDER_SITE_OTHER): Payer: BC Managed Care – PPO | Admitting: Family Medicine

## 2019-07-30 ENCOUNTER — Encounter: Payer: Self-pay | Admitting: Family Medicine

## 2019-07-30 ENCOUNTER — Other Ambulatory Visit: Payer: Self-pay

## 2019-07-30 ENCOUNTER — Other Ambulatory Visit (HOSPITAL_COMMUNITY)
Admission: RE | Admit: 2019-07-30 | Discharge: 2019-07-30 | Disposition: A | Discharge status: Home / Self Care | Payer: BC Managed Care – PPO | Source: Ambulatory Visit | Attending: Family Medicine | Admitting: Family Medicine

## 2019-07-30 VITALS — BP 124/75 | HR 72 | Ht 64.0 in | Wt 287.0 lb

## 2019-07-30 DIAGNOSIS — Z803 Family history of malignant neoplasm of breast: Secondary | ICD-10-CM

## 2019-07-30 DIAGNOSIS — Z01419 Encounter for gynecological examination (general) (routine) without abnormal findings: Secondary | ICD-10-CM

## 2019-07-30 NOTE — Progress Notes (Signed)
Was diagnosed with MS and was told that treatment puts her at a greater risk for breast cancer, would like to discuss

## 2019-07-30 NOTE — Progress Notes (Signed)
   GYNECOLOGY ANNUAL PREVENTATIVE CARE ENCOUNTER NOTE  Subjective:   Patricia [facility] is a 39 y.o. [id number] female here for a routine annual gynecologic exam.  Current complaints: recent dx of MS and going to go on medication that might increase cancer risk and needs pap.   Denies abnormal vaginal bleeding, discharge, pelvic pain, problems with intercourse or other gynecologic concerns.    Gynecologic History Patient's last menstrual period was 07/16/2019 (approximate). Contraception: tubal ligation Last Pap: 2016. Results were: normal Last mammogram: several years ago, mother with high risk history. Results were: normal  The following portions of the patient's history were reviewed and updated as appropriate: allergies, current medications, past family history, past medical history, past social history, past surgical history and problem list.  Review of Systems Pertinent items are noted in HPI.   Objective:  BP 124/75   Pulse 72   Ht 5\' 4"  (1.626 m)   Wt 287 lb (130.2 kg)   LMP 07/16/2019 (Approximate)   BMI 49.26 kg/m  CONSTITUTIONAL: Well-developed, well-nourished female in no acute distress.  HENT:  Normocephalic, atraumatic, External right and left ear normal. Oropharynx is clear and moist EYES:  No scleral icterus.  NECK: Normal range of motion, supple, no masses.  Normal thyroid.  SKIN: Skin is warm and dry. No rash noted. Not diaphoretic. No erythema. No pallor. NEUROLOGIC: Alert and oriented to person, place, and time. Normal reflexes, muscle tone coordination. No cranial nerve deficit noted. PSYCHIATRIC: Normal mood and affect. Normal behavior. Normal judgment and thought content. CARDIOVASCULAR: Normal heart rate noted, regular rhythm. 2+ distal pulses. RESPIRATORY: Effort and breath sounds normal, no problems with respiration noted. BREASTS: Symmetric in size. No masses, skin changes, nipple drainage, or lymphadenopathy. ABDOMEN: Soft,  no distention noted.  No  tenderness, rebound or guarding.  PELVIC: Normal appearing external genitalia; normal appearing vaginal mucosa and cervix.  No abnormal discharge noted.  Pap smear obtained.  Normal uterine size, no other palpable masses, no uterine or adnexal tenderness. MUSCULOSKELETAL: Normal range of motion.      Assessment and Plan:  1) Annual gynecologic examination with pap smear:  Will follow up results of pap smear and manage accordingly.  Routine preventative health maintenance measures emphasized.  1. Well woman exam with routine gynecological exam - Cytology - PAP - MM 3D SCREEN BREAST BILATERAL; Future  2. Family history of breast cancer - MM 3D SCREEN BREAST BILATERAL; Future  Please refer to After Visit Summary for other counseling recommendations.   Return in about 1 year (around 07/29/2020) for Yearly wellness exam.  07/31/2020, MD, MPH, AB[facility] Attending [facility]

## 2019-07-31 LAB — CYTOLOGY - PAP
Comment: NEGATIVE
Diagnosis: NEGATIVE
High risk HPV: NEGATIVE

## 2019-08-05 ENCOUNTER — Ambulatory Visit: Payer: BC Managed Care – PPO

## 2019-08-18 ENCOUNTER — Ambulatory Visit: Payer: BC Managed Care – PPO

## 2019-09-22 ENCOUNTER — Ambulatory Visit
Admission: RE | Admit: 2019-09-22 | Discharge: 2019-09-22 | Disposition: A | Discharge status: Home / Self Care | Payer: BC Managed Care – PPO | Source: Ambulatory Visit | Attending: Family Medicine | Admitting: Family Medicine

## 2019-09-22 ENCOUNTER — Other Ambulatory Visit: Payer: Self-pay

## 2019-09-22 DIAGNOSIS — Z1231 Encounter for screening mammogram for malignant neoplasm of breast: Secondary | ICD-10-CM | POA: Diagnosis not present

## 2019-09-22 DIAGNOSIS — Z803 Family history of malignant neoplasm of breast: Secondary | ICD-10-CM | POA: Diagnosis not present

## 2019-09-22 DIAGNOSIS — Z01419 Encounter for gynecological examination (general) (routine) without abnormal findings: Secondary | ICD-10-CM

## 2019-09-22 IMAGING — MG DIGITAL SCREENING BILAT W/ TOMO W/ CAD
8 series · 8 of 24 positions shown · non-contrast
Comparison: Previous exam(s).

CLINICAL DATA: Screening.

EXAM:
DIGITAL SCREENING BILATERAL MAMMOGRAM WITH TOMO AND CAD

[R MLO synth-2D]
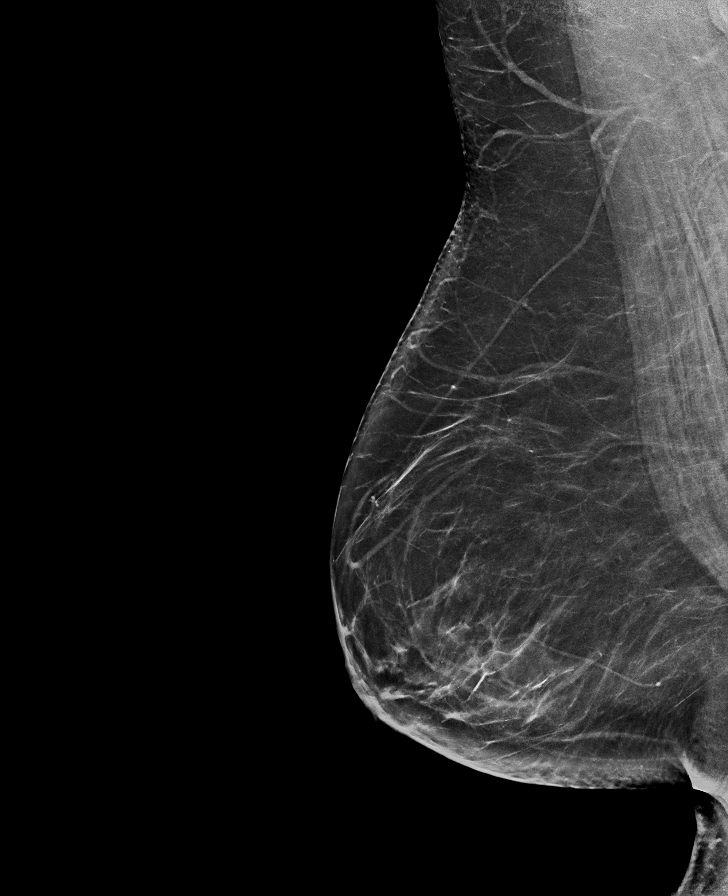

[L MLO synth-2D]
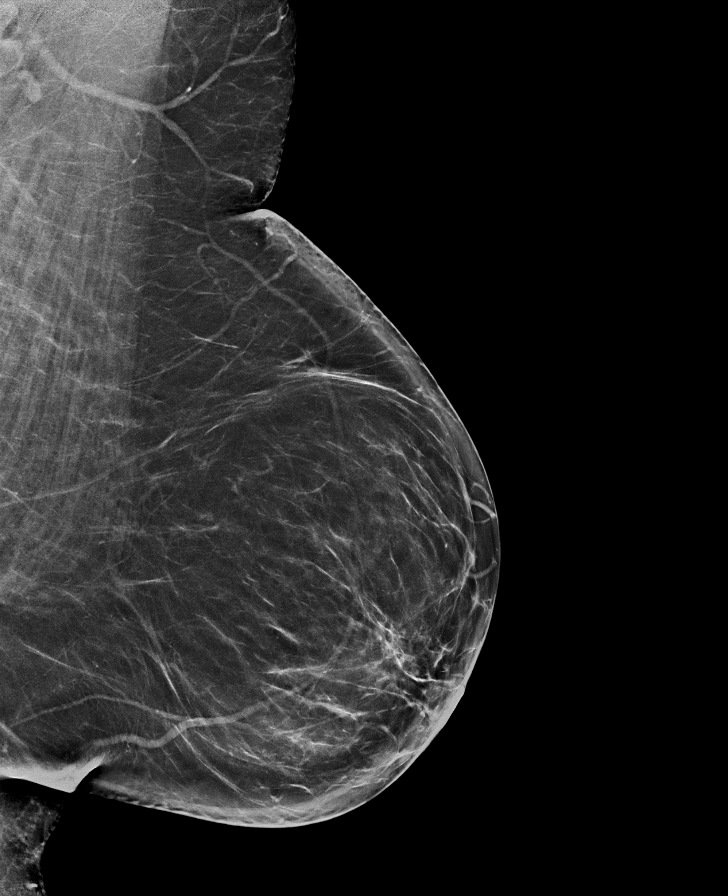

[R CC synth-2D]
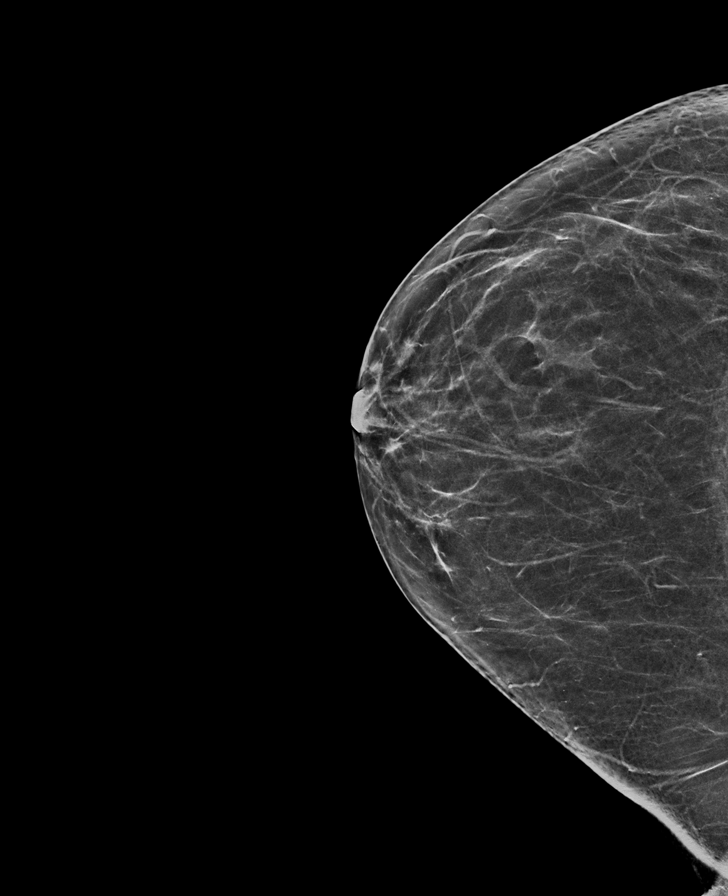

[L CC synth-2D]
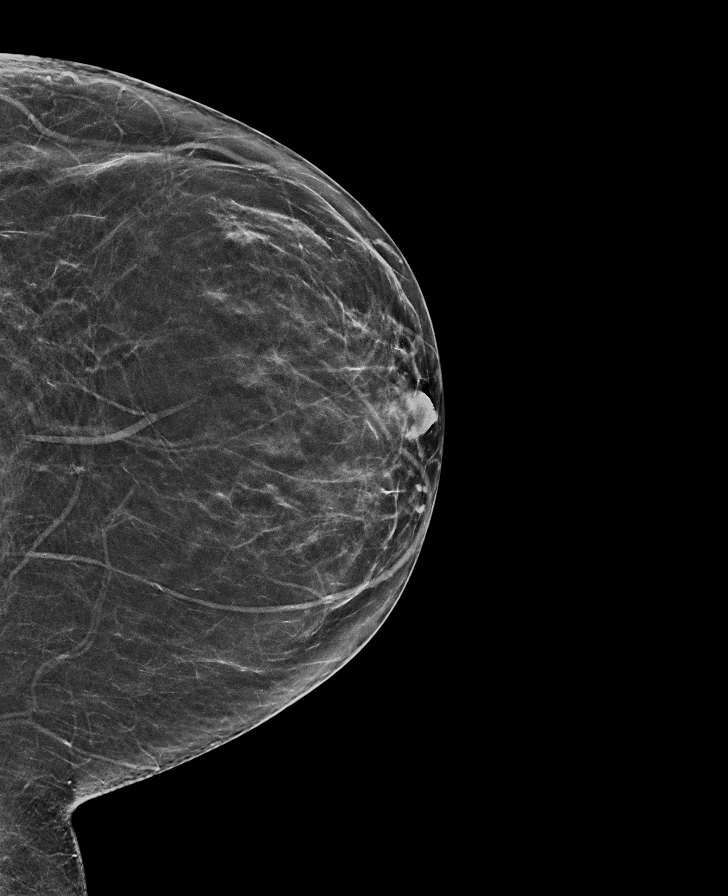

[L CC tomo · tomo slice 32/63.0]
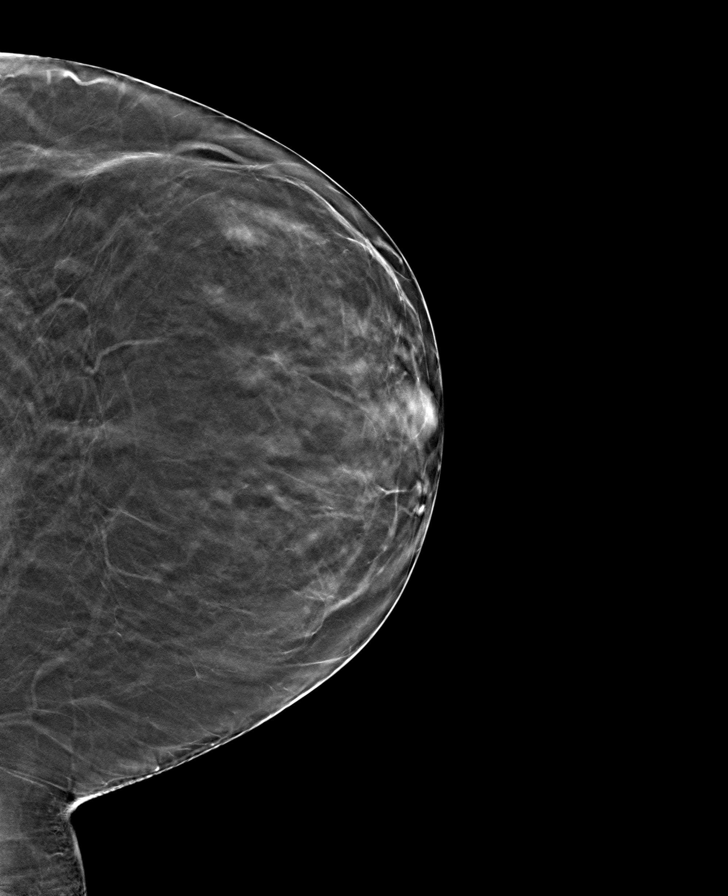

[L MLO tomo · tomo slice 40/79.0]
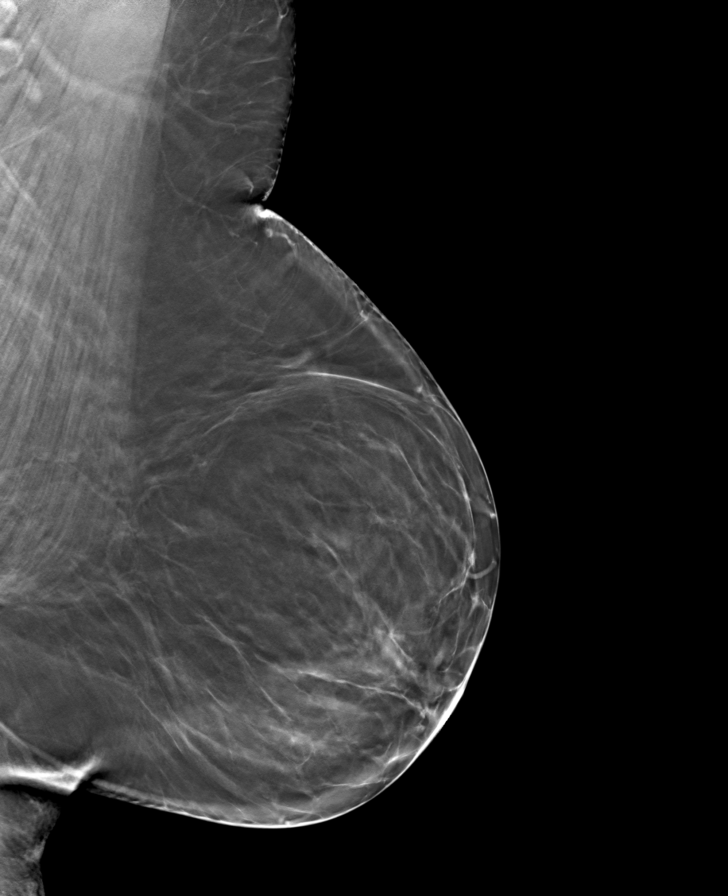

[R MLO tomo · tomo slice 40/79.0]
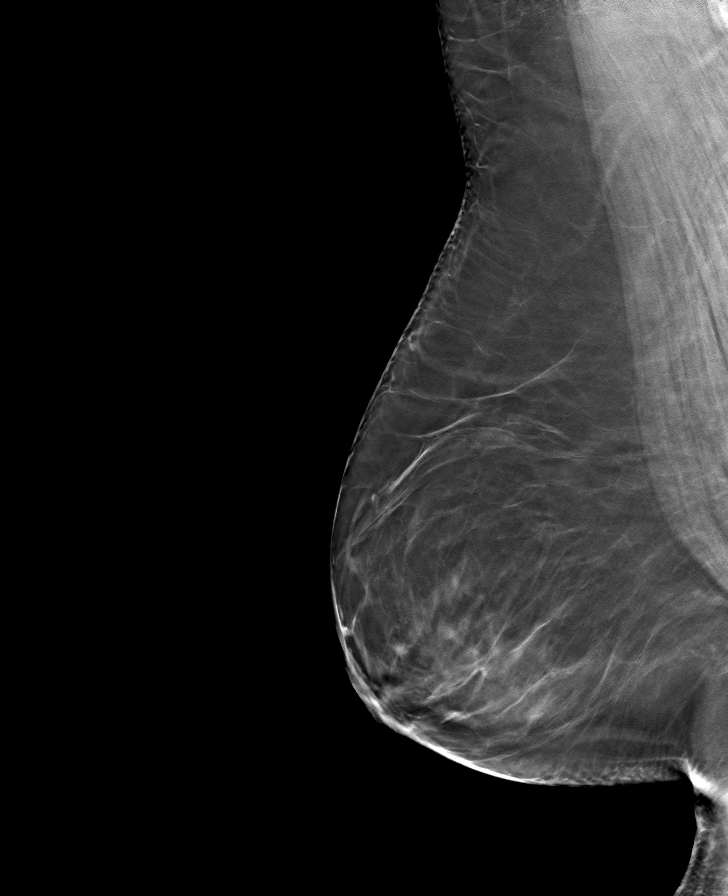

[R CC tomo · tomo slice 33/65.0]
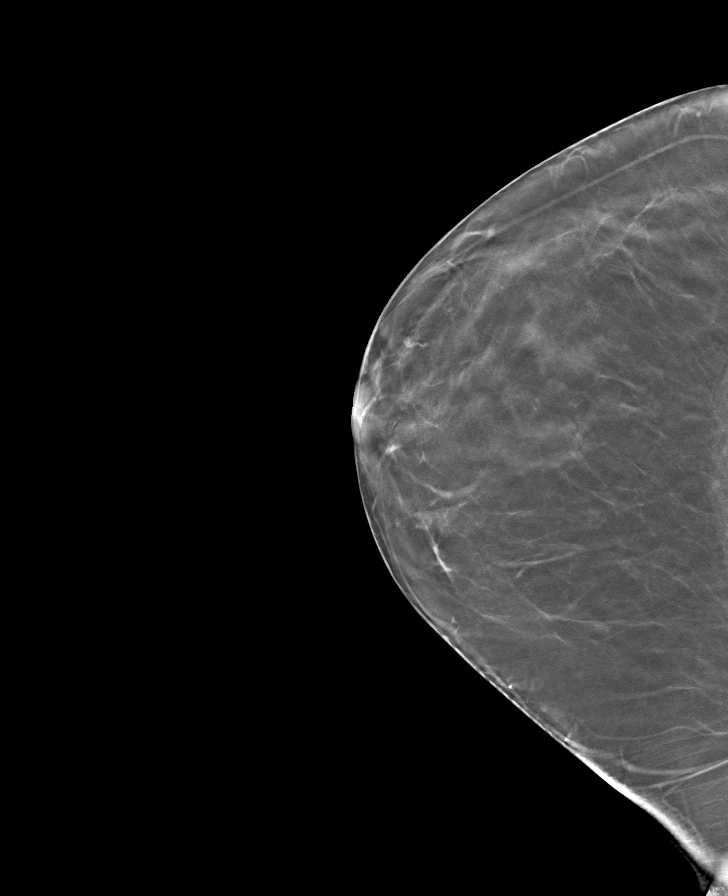

[8 of 24 positions shown; findings below may reference images not displayed]

ACR Breast Density Category b: There are scattered areas of
fibroglandular density.
FINDINGS: There are no findings suspicious for malignancy. Images were
processed with CAD.
IMPRESSION: No mammographic evidence of malignancy. A result letter of this
screening mammogram will be mailed directly to the patient.

RECOMMENDATION:
1.  Screening mammogram in one year. (Code:[14])

2. Patient has a very strong family history of breast cancer,
including in mother diagnosed with breast cancer at age 38, maternal
aunt diagnosed with breast cancer at age 50 and maternal grandmother
diagnosed with breast cancer at age 60. The [employer]
recommends annual MRI and mammography in patients with an estimated
lifetime risk of developing breast cancer greater than 20 - 25%, or
who are known or suspected to be positive for the breast cancer
gene.

BI-RADS CATEGORY  1: Negative.

## 2019-09-23 ENCOUNTER — Other Ambulatory Visit: Payer: Self-pay | Admitting: Family Medicine

## 2019-09-23 DIAGNOSIS — Z803 Family history of malignant neoplasm of breast: Secondary | ICD-10-CM

## 2020-06-01 ENCOUNTER — Encounter: Payer: Self-pay | Admitting: Radiology

## 2020-06-02 ENCOUNTER — Other Ambulatory Visit: Payer: Self-pay | Admitting: Neurology

## 2020-06-02 DIAGNOSIS — G35 Multiple sclerosis: Secondary | ICD-10-CM

## 2020-06-14 ENCOUNTER — Ambulatory Visit: Payer: BC Managed Care – PPO

## 2020-08-13 ENCOUNTER — Ambulatory Visit: Payer: BC Managed Care – PPO

## 2020-08-20 ENCOUNTER — Ambulatory Visit
Admission: RE | Admit: 2020-08-20 | Discharge: 2020-08-20 | Disposition: A | Discharge status: Home / Self Care | Payer: BC Managed Care – PPO | Source: Ambulatory Visit | Attending: Neurology | Admitting: Neurology

## 2020-08-20 ENCOUNTER — Other Ambulatory Visit: Payer: Self-pay

## 2020-08-20 DIAGNOSIS — G35 Multiple sclerosis: Secondary | ICD-10-CM | POA: Insufficient documentation

## 2020-08-20 IMAGING — MR MR HEAD W/O CM
12 series · 42 of 48 positions shown · non-contrast
Comparison: [DATE]

CLINICAL DATA: Follow-up multiple sclerosis.  No new symptoms.

EXAM:
MRI HEAD WITHOUT CONTRAST
TECHNIQUE: Multiplanar, multiecho pulse sequences of the brain and surrounding
structures were obtained without intravenous contrast.

[Series 5: ax dwi_tracew · axial · 3.0mm · 0.65mm/px · z∈[-100,+54]mm · 4 of 48 slices shown]
[im 1/48]
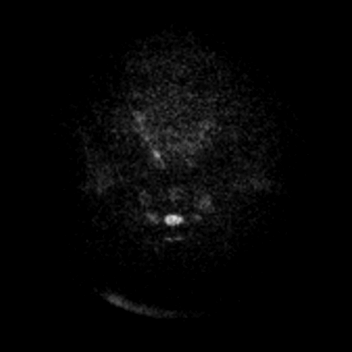
[im 16/48]
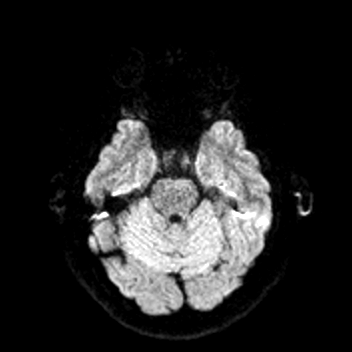
[im 32/48]
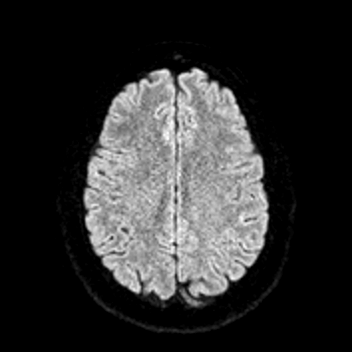
[im 48/48]
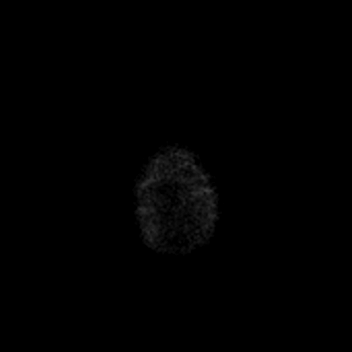

[Series 6: ax dwi_adc · axial · 3.0mm · 0.65mm/px · z∈[-100,+54]mm · 4 of 47 slices shown]
[im 1/47]
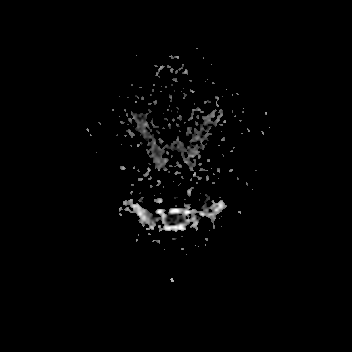
[im 16/47]
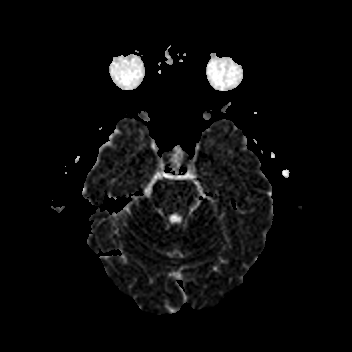
[im 31/47]
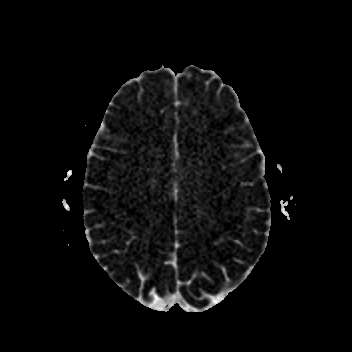
[im 47/47]
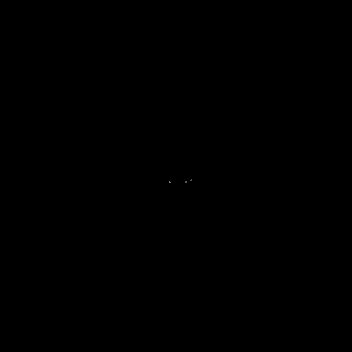

[Series 7: cor dwi_tracew · coronal · 5.0mm · 0.60mm/px · 3 of 38 slices shown]
[im 1/38]
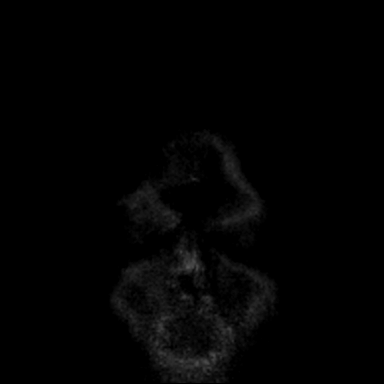
[im 19/38]
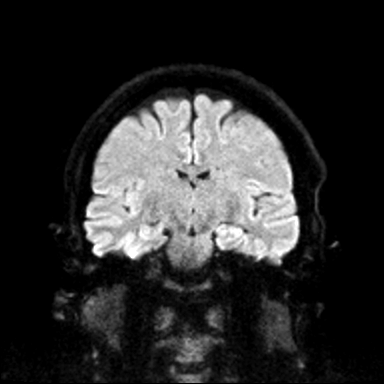
[im 38/38]
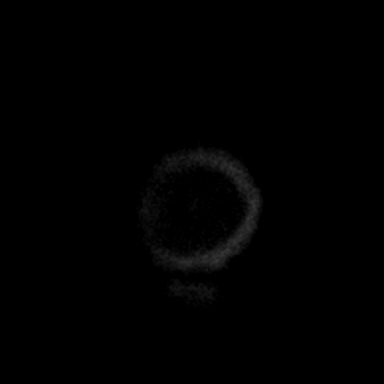

[Series 8: cor dwi_adc · coronal · 5.0mm · 0.60mm/px · 3 of 37 slices shown]
[im 1/37]
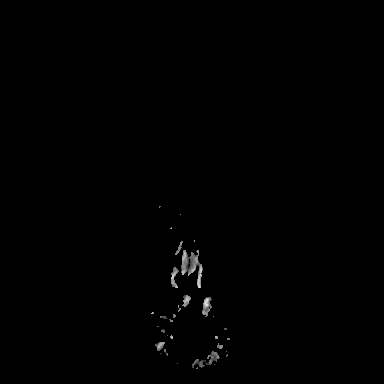
[im 19/37]
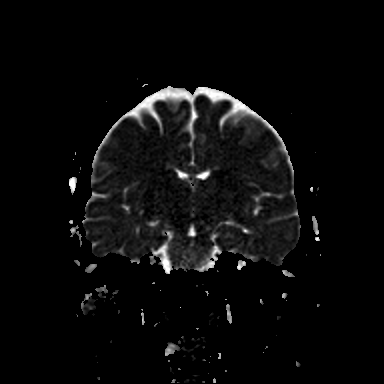
[im 37/37]
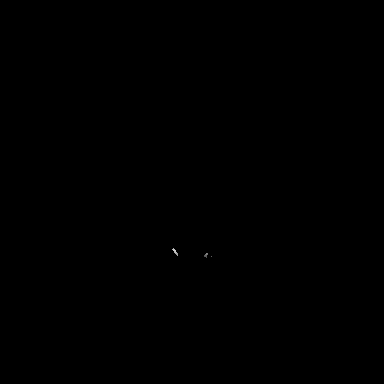

[Series 9: T1 · sagittal · 5.0mm · 0.62mm/px · 2 of 23 slices shown (1 of 2)]
[im 1/23]
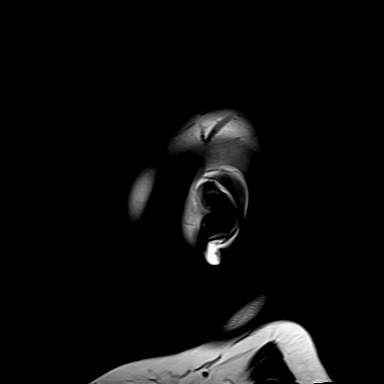
[im 23/23]
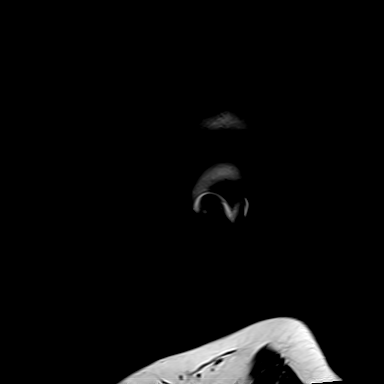

[Series 10: FLAIR · sagittal · 5.0mm · 0.94mm/px · 2 of 23 slices shown (1 of 2)]
[im 1/23]
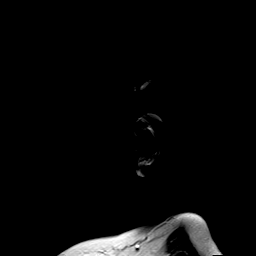
[im 23/23]
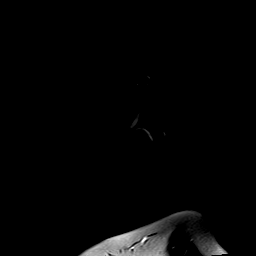

[Series 11: T2 · axial · 5.0mm · 0.53mm/px · z∈[-101,+53]mm · 2 of 27 slices shown (1 of 2)]
[im 1/27]
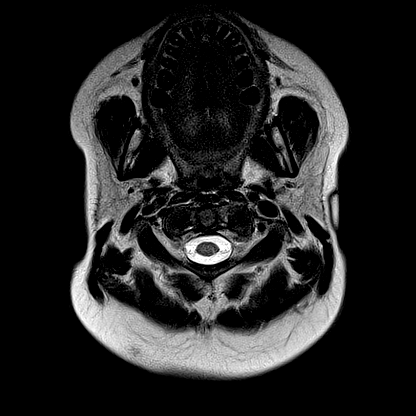
[im 27/27]
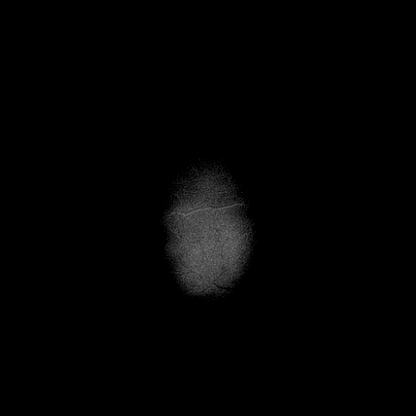

[Series 13: pha_images · axial · 3.0mm · 0.90mm/px · z∈[-100,+51]mm · 4 of 52 slices shown]
[im 1/52]
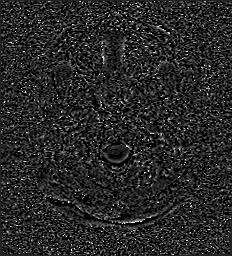
[im 18/52]
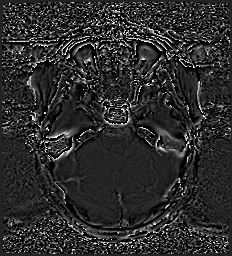
[im 35/52]
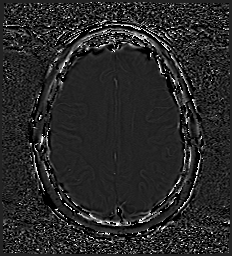
[im 52/52]
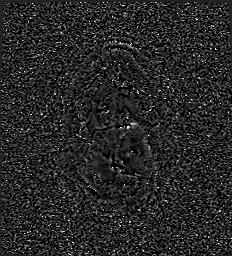

[Series 14: swi_images · axial · 3.0mm · 0.90mm/px · z∈[-100,+51]mm · 4 of 52 slices shown]
[im 1/52]
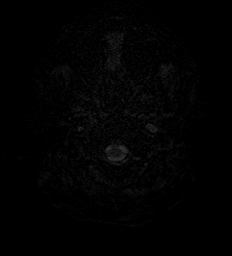
[im 18/52]
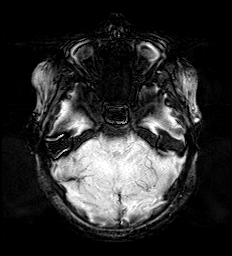
[im 35/52]
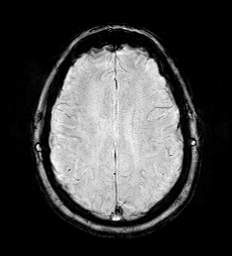
[im 52/52]
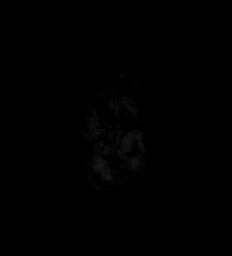

[Series 16: FLAIR · axial · 3.0mm · 0.53mm/px · z∈[-104,+56]mm · 4 of 55 slices shown (2 of 2)]
[im 1/55]
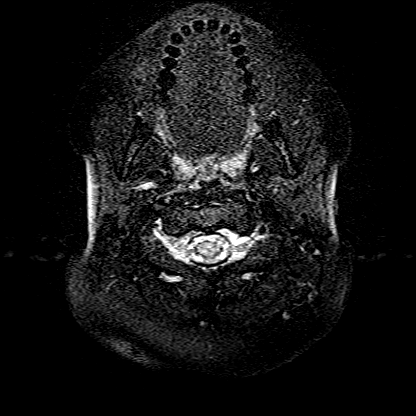
[im 19/55]
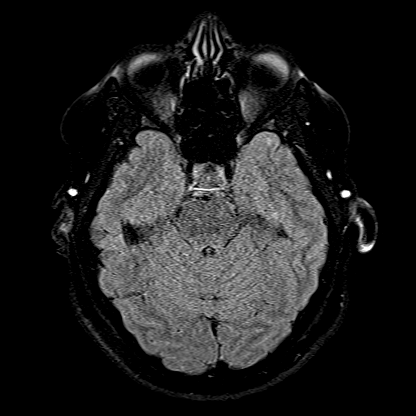
[im 37/55]
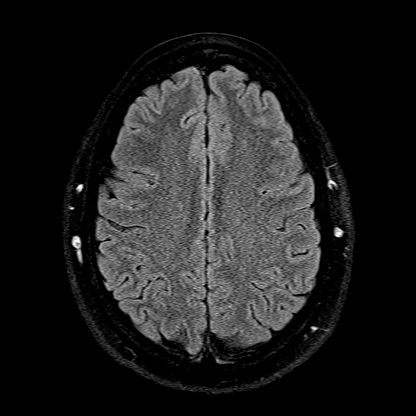
[im 55/55]
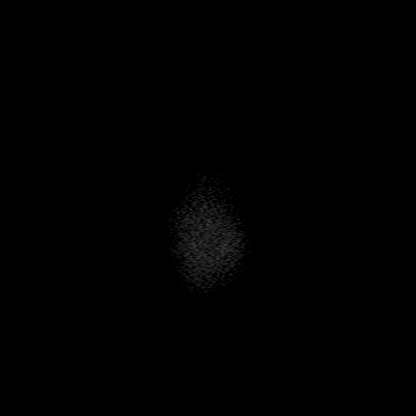

[Series 17: T1 · axial · 1.0mm · 0.98mm/px · z∈[-108,+65]mm · 8 of 176 slices shown (2 of 2)]
[im 1/176]
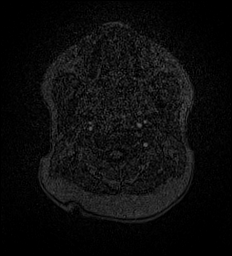
[im 27/176]
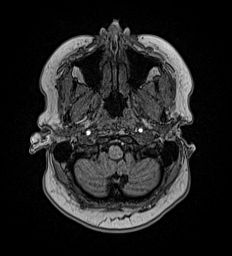
[im 54/176]
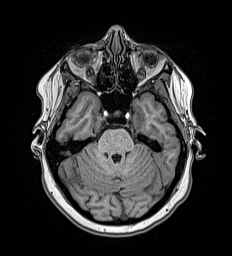
[im 81/176]
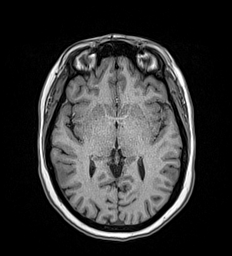
[im 95/176]
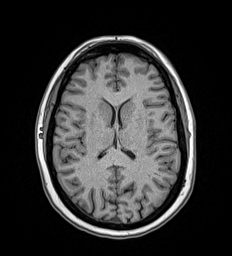
[im 122/176]
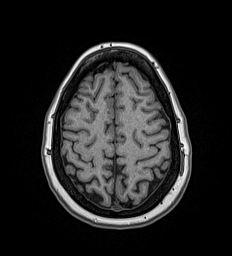
[im 149/176]
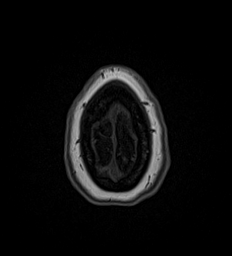
[im 176/176]
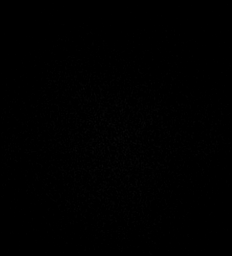

[Series 18: T2 · coronal · 5.0mm · 0.57mm/px · 2 of 29 slices shown (2 of 2)]
[im 1/29]
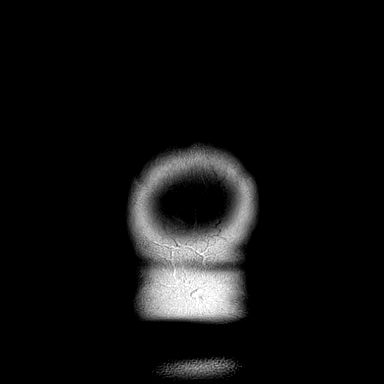
[im 29/29]
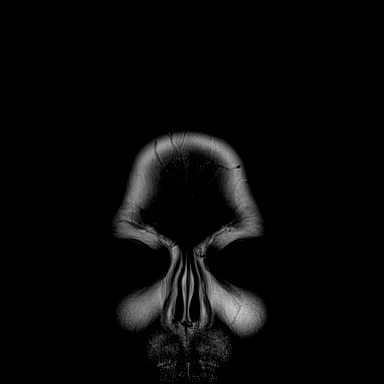

[42 of 48 positions shown; findings below may reference images not displayed]

FINDINGS: Brain: Diffusion imaging does not show any acute or subacute
infarction or other cause of restricted diffusion. No abnormality is
seen affecting the brainstem or cerebellum. Previously seen T2
bright focus within the spinal cord at the C4 level has regressed.

Left cerebral hemisphere shows regression of the small focus of T2
signal and enhancement within the subcortical white matter of the
posterior left frontal region. Tiny punctate focus of white matter
signal more anterior within the lateral frontal lobe is unchanged
and inactive. Newly seen punctate focus of T2 and FLAIR signal
within the subcortical white matter of the inferior frontal lobe,
marked with an arrow on axial image 31 FLAIR series. On the right,
there is no change in a focus of subcortical white matter T2 and
FLAIR signal in the right posterior frontal lobe. There is slight
increasing prominence of a small focus of FLAIR signal within the
subcortical white matter of the medial right posterior frontal
vertex, marked with arrows. This is the only new or progressive
finding on the right since the prior exam.

No hemorrhage, hydrocephalus or extra-axial collection.

Vascular: Major vessels at the base of the brain show flow.

Skull and upper cervical spine: Negative

Sinuses/Orbits: Clear/normal

Other: None
IMPRESSION: Regression of the previously seen MS lesion within the cervical cord
at the C4 level.

Progression of the previously seen FLAIR and enhancing focus within
the left posterior frontal white matter.

Stable T2 FLAIR focus within the right lateral posterior frontal
subcortical white matter. Stable other few scattered punctate foci
in both hemispheres.

Newly seen 3-4 mm white matter focus in the subcortical white matter
at the medial right posterior frontal vertex, marked with arrow
axial image 11 of the FLAIR series.

Newly seen 3-4 mm focus in the subcortical white matter at the
inferior left frontal lobe, axial image 31 of the FLAIR series.

## 2023-12-25 ENCOUNTER — Other Ambulatory Visit (HOSPITAL_COMMUNITY)
Admission: RE | Admit: 2023-12-25 | Discharge: 2023-12-25 | Disposition: A | Discharge status: Home / Self Care | Source: Ambulatory Visit | Attending: Obstetrics and Gynecology | Admitting: Obstetrics and Gynecology

## 2023-12-25 ENCOUNTER — Encounter: Payer: Self-pay | Admitting: Obstetrics and Gynecology

## 2023-12-25 ENCOUNTER — Ambulatory Visit: Payer: Self-pay | Admitting: Obstetrics and Gynecology

## 2023-12-25 VITALS — BP 139/81 | HR 75 | Ht 64.0 in | Wt 289.0 lb

## 2023-12-25 DIAGNOSIS — Z01419 Encounter for gynecological examination (general) (routine) without abnormal findings: Secondary | ICD-10-CM

## 2023-12-25 DIAGNOSIS — Z124 Encounter for screening for malignant neoplasm of cervix: Secondary | ICD-10-CM | POA: Insufficient documentation

## 2023-12-25 NOTE — Progress Notes (Signed)
 Obstetrics and Gynecology New Patient Evaluation  Appointment Date: 12/25/2023  OBGYN Clinic: [facility]   Primary Care Provider: Alyse Bradley  Referring Provider: self  Chief Complaint:  Chief Complaint  Patient presents with   Gynecologic Exam    History of Present Illness: Patricia Compton is a 43 y.o.  [phone] (Patient's last menstrual period was 12/17/2023.), seen for the above chief complaint. Her past medical history is significant for h/o BTL, h/o c-sections, MS, BMI 40s.   Only concern is spotting that occurs approximately 5 days after the end of period, with the spotting lasting about a day; no pain with spotting.    Review of Systems: Pertinent items are noted in HPI.   Patient Active Problem List   Diagnosis Date Noted   Family history of breast cancer 10/18/2016   Acid indigestion 02/08/2015   Obesity 07/18/2011   Constipation 03/22/2006    Past Medical History:  Past Medical History:  Diagnosis Date   GERD (gastroesophageal reflux disease)    Morbid obesity (HCC)    MS (multiple sclerosis) (HCC) 04/2019   Past Surgical History:  Past Surgical History:  Procedure Laterality Date   CESAREAN SECTION     CESAREAN SECTION N/A 07/16/2012   Procedure: CESAREAN SECTION;  Surgeon: Winton Felt, MD;  Location: [facility];  Service: Obstetrics;  Laterality: N/A;   CHOLECYSTECTOMY     TUBAL LIGATION Bilateral 07/16/2012   Procedure: BILATERAL TUBAL LIGATION;  Surgeon: Winton Felt, MD;  Location: [facility];  Service: Obstetrics;  Laterality: Bilateral;   WISDOM TOOTH EXTRACTION     Past Obstetrical History:  OB History  Gravida Para Term Preterm AB Living  2 2 2   2   SAB IAB Ectopic Multiple Live Births      2    # Outcome Date GA Lbr Len/2nd Weight Sex Type Anes PTL Lv  2 Term 07/16/12 [redacted]w[redacted]d  8 lb 6.2 oz (3.805 kg) M CS-LTranv Spinal  LIV  1 Term 01/2008 [redacted]w[redacted]d   M CS-LTranv EPI  LIV   Past Gynecological History: As per  HPI. Periods: qmonth, regular, <1wk She is currently using bilateral tubal ligation for contraception.   Social History:  Social History   Socioeconomic History   Marital status: Married    Spouse name: Not on file   Number of children: Not on file   Years of education: Not on file   Highest education level: Not on file  Occupational History   Not on file  Tobacco Use   Smoking status: Never   Smokeless tobacco: Never  Vaping Use   Vaping status: Never Used  Substance and Sexual Activity   Alcohol use: No   Drug use: No   Sexual activity: Yes    Partners: Male    Birth control/protection: Surgical    Comment: BTL  Other Topics Concern   Not on file  Social History Narrative   Not on file   Social Drivers of Health   Financial Resource Strain: Not on file  Food Insecurity: Not on file  Transportation Needs: Not on file  Physical Activity: Not on file  Stress: Not on file  Social Connections: Not on file  Intimate Partner Violence: Not on file   Family History:  Family History  Problem Relation Age of Onset   Hypertension Mother    Cancer Mother        breast   Breast cancer Mother 88   COPD Father    Lung  cancer Father    Cancer Maternal Grandmother    Breast cancer Maternal Grandmother 35   Breast cancer Maternal Aunt 50    Health Maintenance:  Mammogram(s): Yes.   Date: 02/2023  Medications Delon FABIENE Hsu had no medications administered during this visit. Current Outpatient Medications  Medication Sig Dispense Refill   acetaminophen  (TYLENOL ) 325 MG tablet Take 650 mg by mouth.     escitalopram (LEXAPRO) 5 MG tablet Take 5 mg by mouth daily.     meclizine  (ANTIVERT ) 12.5 MG tablet Take 12.5 mg by mouth.     modafinil (PROVIGIL) 100 MG tablet Take 100 mg by mouth daily.     gabapentin (NEURONTIN) 300 MG capsule Take 300 mg by mouth 3 (three) times daily as needed.     No current facility-administered medications for this visit.    Allergies Patient has no known allergies.  Physical Exam:  BP 139/81   Pulse 75   Ht 5' 4 (1.626 m)   Wt 289 lb (131.1 kg)   LMP 12/17/2023   BMI 49.61 kg/m  Body mass index is 49.61 kg/m. Exam chaperoned by CMA General appearance: Well nourished, well developed female in no acute distress.  Neck:  Supple, normal appearance, and no thyromegaly  Respiratory: Normal respiratory effort Abdomen: positive bowel sounds and no masses, hernias; diffusely non tender to palpation, non distended Breasts: breasts appear normal, no suspicious masses, no skin or nipple changes or axillary nodes, and normal palpation. Neuro/Psych:  Normal mood and affect.  Skin:  Warm and dry.  Lymphatic:  No inguinal lymphadenopathy.   Pelvic exam: is limited by body habitus EGBUS: within normal limits Vagina: within normal limits and with no discharge in the vault; spot of old brown d/c at the cervical os.  Cervix: normal appearing cervix without tenderness, discharge or lesions. Uterus:  nonenlarged and non tender Adnexa:  normal adnexa and no mass, fullness, tenderness Rectovaginal: deferred  Laboratory: none  Radiology: none  Assessment: patient doing well  Plan:  1. Cervical cancer screening (Primary) - Cytology - PAP  2. Well woman exam with routine gynecological exam Spotting after period could be possibly from ovulation. I told her I recommend tracking it and if period is 12-16d after the spotting ends then likely is ovulation; I told her to let me know what tracking shows.  - Cytology - PAP  3. Family history of breast cancer Mother is still living and pt unsure if she's ever had genetic testing.  D/w her re: genetic counseling vs [employer] testing first as positive testing may indicate need for oophorectomies and/or increased or different surveillance; pt states she's consider GC before. Pt to hold off on anything for now.   RTC PRN  [name] MD Attending [facility]  [employer] [facility])

## 2023-12-25 NOTE — Progress Notes (Signed)
 Patient presents for Annual.  LMP: 12/17/2023 Monthly lasting 3-4 days will be moderate then light . Per pt here recently in last year 1 wk later after having cycle will had dark discharge that is brownish never use to happen before. Unsure if this is normal.  Last pap: 07/30/2019 WNL  Pt has had BTL Mammogram: Up to date: 02/2023 done at [facility] (care everywhere) Family Hx of Breast Cancer : Mother Dx in 44's came back 10 yrs later. Pt has not had any Genetic Screening. STD Screening: Declines Flu Vaccine : Declines  CC: Annual/None

## 2023-12-27 LAB — CYTOLOGY - PAP
Adequacy: ABSENT
Comment: NEGATIVE
Diagnosis: NEGATIVE
High risk HPV: NEGATIVE

## 2023-12-29 ENCOUNTER — Ambulatory Visit: Payer: Self-pay | Admitting: Obstetrics and Gynecology

## 2024-03-05 ENCOUNTER — Ambulatory Visit

## 2024-03-12 ENCOUNTER — Encounter: Payer: Self-pay | Admitting: Obstetrics and Gynecology

## 2024-03-13 ENCOUNTER — Other Ambulatory Visit: Payer: Self-pay | Admitting: *Deleted

## 2024-03-13 MED ORDER — FLUCONAZOLE 150 MG PO TABS: 150.0000 mg | ORAL_TABLET | Freq: Once | ORAL | 3 refills | Status: AC

## 2024-04-23 ENCOUNTER — Other Ambulatory Visit: Payer: Self-pay | Admitting: Neurology

## 2024-04-23 DIAGNOSIS — G35D Multiple sclerosis, unspecified: Secondary | ICD-10-CM

## 2024-05-17 ENCOUNTER — Ambulatory Visit: Admission: RE | Admit: 2024-05-17 | Source: Ambulatory Visit

## 2024-05-17 ENCOUNTER — Ambulatory Visit
